# Patient Record
Sex: Male | Born: 1975 | Race: White | Hispanic: No | Marital: Single | Smoking: Current every day smoker
Health system: Southern US, Community
[De-identification: ages and names within clinical notes are randomized; demographics above are authoritative.]

---

## 2004-09-11 ENCOUNTER — Inpatient Hospital Stay: Payer: Self-pay | Admitting: Surgery

## 2005-08-16 ENCOUNTER — Ambulatory Visit: Payer: Self-pay | Admitting: Family Medicine

## 2006-10-18 ENCOUNTER — Emergency Department: Payer: Self-pay | Admitting: Emergency Medicine

## 2006-10-21 ENCOUNTER — Emergency Department: Payer: Self-pay | Admitting: Emergency Medicine

## 2008-02-25 ENCOUNTER — Emergency Department: Payer: Self-pay | Admitting: Emergency Medicine

## 2011-07-24 ENCOUNTER — Emergency Department: Payer: Self-pay | Admitting: Internal Medicine

## 2011-07-24 LAB — DRUG SCREEN, URINE
Amphetamines, Ur Screen: NEGATIVE (ref ?–1000)
Barbiturates, Ur Screen: NEGATIVE (ref ?–200)
Cannabinoid 50 Ng, Ur ~~LOC~~: POSITIVE (ref ?–50)
Cocaine Metabolite,Ur ~~LOC~~: NEGATIVE (ref ?–300)
Methadone, Ur Screen: NEGATIVE (ref ?–300)
Tricyclic, Ur Screen: NEGATIVE (ref ?–1000)

## 2011-07-24 LAB — COMPREHENSIVE METABOLIC PANEL
Alkaline Phosphatase: 92 U/L (ref 50–136)
Calcium, Total: 9 mg/dL (ref 8.5–10.1)
Chloride: 102 mmol/L (ref 98–107)
Co2: 26 mmol/L (ref 21–32)
EGFR (African American): >60
EGFR (Non-African Amer.): >60
Osmolality: 274 (ref 275–301)
SGPT (ALT): 48 U/L
Total Protein: 7.7 g/dL (ref 6.4–8.2)

## 2011-07-24 LAB — URINALYSIS, COMPLETE
Bacteria: NONE SEEN
Bilirubin,UR: NEGATIVE
Blood: NEGATIVE
Leukocyte Esterase: NEGATIVE
Nitrite: NEGATIVE
Ph: 6 (ref 4.5–8.0)
RBC,UR: NONE SEEN /HPF (ref 0–5)
Squamous Epithelial: NONE SEEN
WBC UR: NONE SEEN /HPF (ref 0–5)

## 2011-07-24 LAB — CBC
HCT: 43.1 % (ref 40.0–52.0)
HGB: 15.1 g/dL (ref 13.0–18.0)
MCH: 34.4 pg — ABNORMAL HIGH (ref 26.0–34.0)
MCHC: 35.1 g/dL (ref 32.0–36.0)
MCV: 98 fL (ref 80–100)
Platelet: 312 10*3/uL (ref 150–440)
RDW: 12.5 % (ref 11.5–14.5)

## 2011-07-24 LAB — ETHANOL
Ethanol %: 0.084 % — ABNORMAL HIGH (ref 0.000–0.080)
Ethanol: 84 mg/dL

## 2011-07-25 LAB — POTASSIUM: Potassium: 5 mmol/L (ref 3.5–5.1)

## 2011-10-30 ENCOUNTER — Emergency Department: Payer: Self-pay | Admitting: Emergency Medicine

## 2011-10-30 LAB — CBC
HCT: 44.4 % (ref 40.0–52.0)
HGB: 14.8 g/dL (ref 13.0–18.0)
MCH: 32.5 pg (ref 26.0–34.0)
MCHC: 33.4 g/dL (ref 32.0–36.0)
MCV: 97 fL (ref 80–100)
Platelet: 335 10*3/uL (ref 150–440)
RBC: 4.55 10*6/uL (ref 4.40–5.90)
RDW: 13.1 % (ref 11.5–14.5)
WBC: 15.3 10*3/uL — ABNORMAL HIGH (ref 3.8–10.6)

## 2011-10-30 LAB — URINALYSIS, COMPLETE
Bacteria: NONE SEEN
Bilirubin,UR: NEGATIVE
Blood: NEGATIVE
Glucose,UR: NEGATIVE mg/dL (ref 0–75)
Ketone: NEGATIVE
Leukocyte Esterase: NEGATIVE
Nitrite: NEGATIVE
Ph: 5 (ref 4.5–8.0)
Protein: 30
RBC,UR: NONE SEEN /HPF (ref 0–5)
Specific Gravity: 1.023 (ref 1.003–1.030)
Squamous Epithelial: NONE SEEN
WBC UR: <1 /HPF (ref 0–5)

## 2011-10-30 LAB — COMPREHENSIVE METABOLIC PANEL
Albumin: 3.8 g/dL (ref 3.4–5.0)
Alkaline Phosphatase: 141 U/L — ABNORMAL HIGH (ref 50–136)
Anion Gap: 7 (ref 7–16)
BUN: 9 mg/dL (ref 7–18)
Bilirubin,Total: 0.7 mg/dL (ref 0.2–1.0)
Calcium, Total: 9 mg/dL (ref 8.5–10.1)
Chloride: 105 mmol/L (ref 98–107)
Co2: 25 mmol/L (ref 21–32)
Creatinine: 0.84 mg/dL (ref 0.60–1.30)
EGFR (African American): >60
EGFR (Non-African Amer.): >60
Glucose: 100 mg/dL — ABNORMAL HIGH (ref 65–99)
Osmolality: 273 (ref 275–301)
Potassium: 4.1 mmol/L (ref 3.5–5.1)
SGOT(AST): 49 U/L — ABNORMAL HIGH (ref 15–37)
SGPT (ALT): 88 U/L — ABNORMAL HIGH
Sodium: 137 mmol/L (ref 136–145)
Total Protein: 7.9 g/dL (ref 6.4–8.2)

## 2011-11-06 ENCOUNTER — Emergency Department: Payer: Self-pay | Admitting: *Deleted

## 2011-11-07 LAB — DRUG SCREEN, URINE
Cocaine Metabolite,Ur ~~LOC~~: NEGATIVE (ref ?–300)
MDMA (Ecstasy)Ur Screen: NEGATIVE (ref ?–500)
Methadone, Ur Screen: NEGATIVE (ref ?–300)
Opiate, Ur Screen: NEGATIVE (ref ?–300)
Phencyclidine (PCP) Ur S: NEGATIVE (ref ?–25)

## 2011-11-07 LAB — COMPREHENSIVE METABOLIC PANEL
Albumin: 4.4 g/dL (ref 3.4–5.0)
Anion Gap: 11 (ref 7–16)
BUN: 8 mg/dL (ref 7–18)
Bilirubin,Total: 0.5 mg/dL (ref 0.2–1.0)
Calcium, Total: 9.7 mg/dL (ref 8.5–10.1)
Creatinine: 0.76 mg/dL (ref 0.60–1.30)
EGFR (African American): >60
EGFR (Non-African Amer.): >60
Glucose: 79 mg/dL (ref 65–99)
Osmolality: 279 (ref 275–301)
Potassium: 4 mmol/L (ref 3.5–5.1)
SGOT(AST): 55 U/L — ABNORMAL HIGH (ref 15–37)
SGPT (ALT): 88 U/L — ABNORMAL HIGH
Sodium: 141 mmol/L (ref 136–145)

## 2011-11-07 LAB — ACETAMINOPHEN LEVEL: Acetaminophen: <2 ug/mL

## 2011-11-07 LAB — ETHANOL
Ethanol %: 0.185 % — ABNORMAL HIGH (ref 0.000–0.080)
Ethanol: 185 mg/dL

## 2011-11-07 LAB — CBC
HGB: 16.8 g/dL (ref 13.0–18.0)
MCHC: 33.3 g/dL (ref 32.0–36.0)
MCV: 97 fL (ref 80–100)
Platelet: 422 10*3/uL (ref 150–440)
RBC: 5.22 10*6/uL (ref 4.40–5.90)
WBC: 13.3 10*3/uL — ABNORMAL HIGH (ref 3.8–10.6)

## 2012-05-24 LAB — DRUG SCREEN, URINE
Barbiturates, Ur Screen: NEGATIVE (ref ?–200)
Benzodiazepine, Ur Scrn: POSITIVE (ref ?–200)
Cannabinoid 50 Ng, Ur ~~LOC~~: POSITIVE (ref ?–50)
Cocaine Metabolite,Ur ~~LOC~~: NEGATIVE (ref ?–300)
MDMA (Ecstasy)Ur Screen: NEGATIVE (ref ?–500)
Phencyclidine (PCP) Ur S: NEGATIVE (ref ?–25)
Tricyclic, Ur Screen: NEGATIVE (ref ?–1000)

## 2012-05-24 LAB — COMPREHENSIVE METABOLIC PANEL
Albumin: 4.2 g/dL (ref 3.4–5.0)
Alkaline Phosphatase: 126 U/L (ref 50–136)
BUN: 9 mg/dL (ref 7–18)
Calcium, Total: 8.5 mg/dL (ref 8.5–10.1)
Chloride: 117 mmol/L — ABNORMAL HIGH (ref 98–107)
Creatinine: 0.84 mg/dL (ref 0.60–1.30)
EGFR (African American): >60
Osmolality: 293 (ref 275–301)
Potassium: 4.2 mmol/L (ref 3.5–5.1)
Sodium: 148 mmol/L — ABNORMAL HIGH (ref 136–145)

## 2012-05-24 LAB — TSH: Thyroid Stimulating Horm: 1.43 u[IU]/mL

## 2012-05-24 LAB — CBC
MCV: 99 fL (ref 80–100)
Platelet: 370 10*3/uL (ref 150–440)
RBC: 4.9 10*6/uL (ref 4.40–5.90)
WBC: 8.3 10*3/uL (ref 3.8–10.6)

## 2012-05-25 ENCOUNTER — Inpatient Hospital Stay: Payer: Self-pay | Admitting: Psychiatry

## 2012-05-25 LAB — URINALYSIS, COMPLETE
Blood: NEGATIVE
Glucose,UR: NEGATIVE mg/dL (ref 0–75)
Leukocyte Esterase: NEGATIVE
Nitrite: NEGATIVE
Protein: NEGATIVE
Specific Gravity: 1.013 (ref 1.003–1.030)
Squamous Epithelial: <1
WBC UR: <1 /HPF (ref 0–5)

## 2012-08-06 IMAGING — US US PELVIS LIMITED
1 series · 13 of 25 positions shown · non-contrast
Comparison: none

REASON FOR EXAM: abscess, extreme swelling
COMMENTS:   May transport without cardiac monitor

[Series 1: us pelvis limited · 0.08mm/px · 92 acquisitions, 13 frames shown]
[im 1/92]
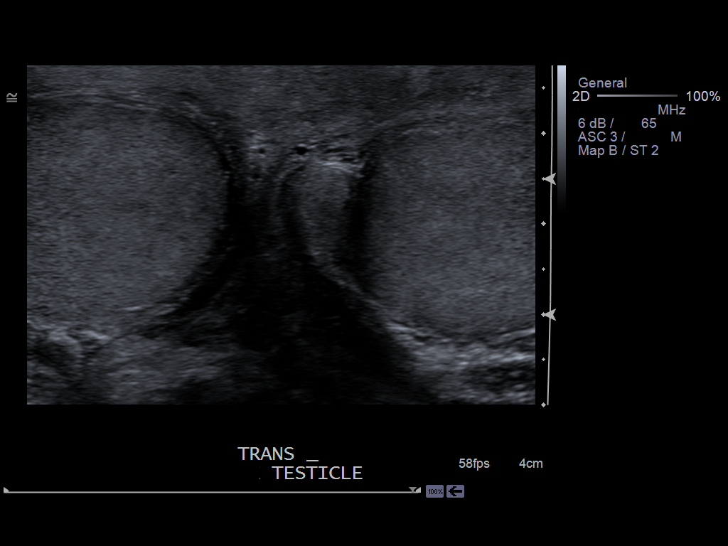
[im 8/92]
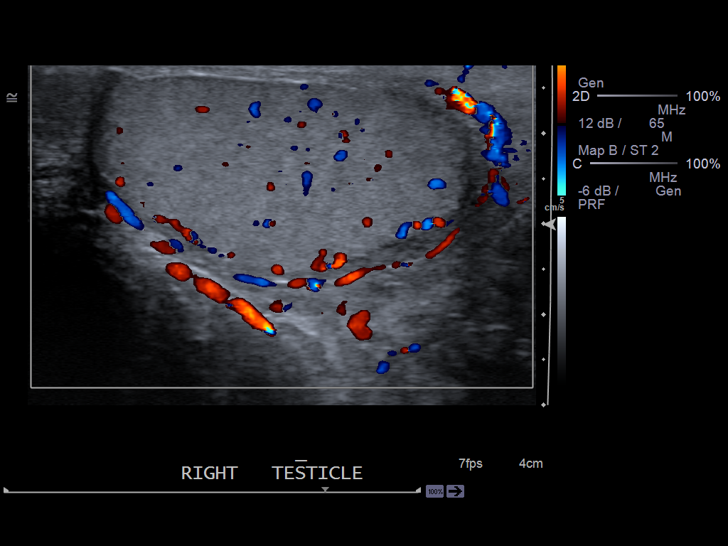
[im 16/92]
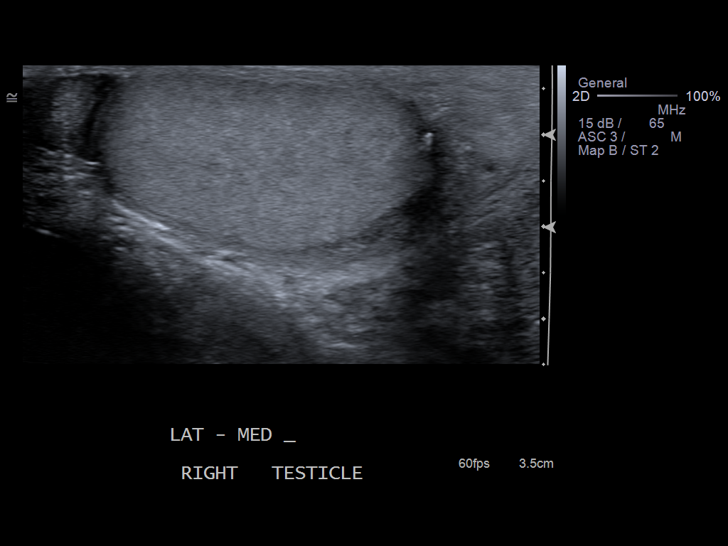
[im 23/92]
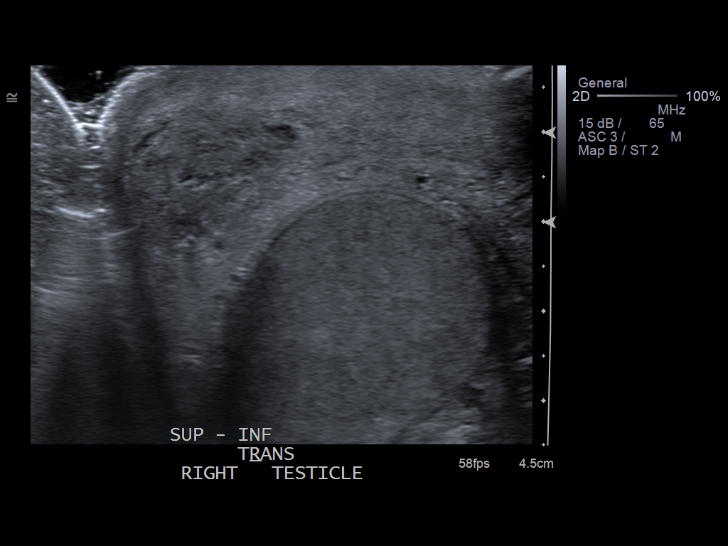
[im 31/92]
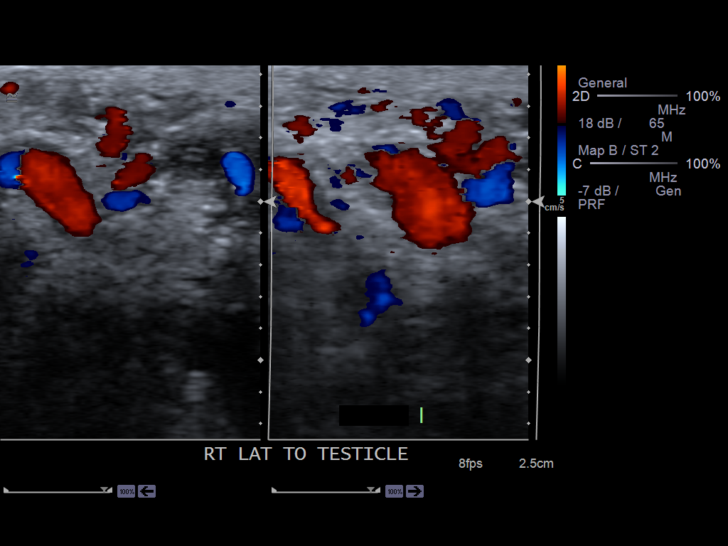
[im 38/92]
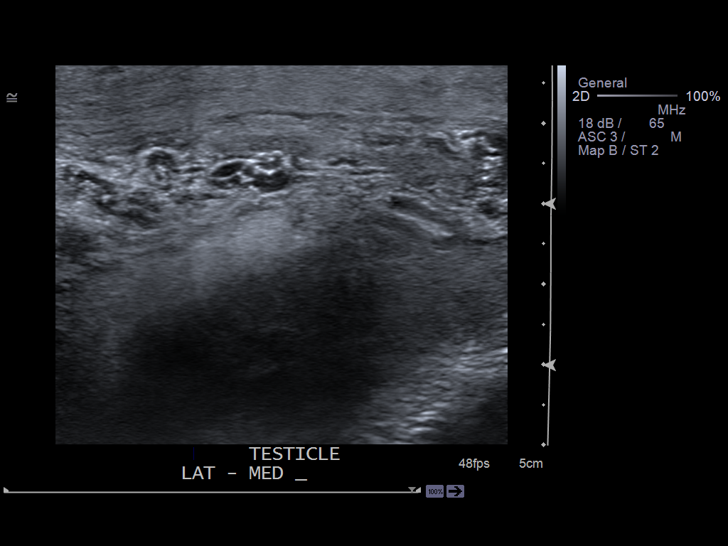
[im 46/92]
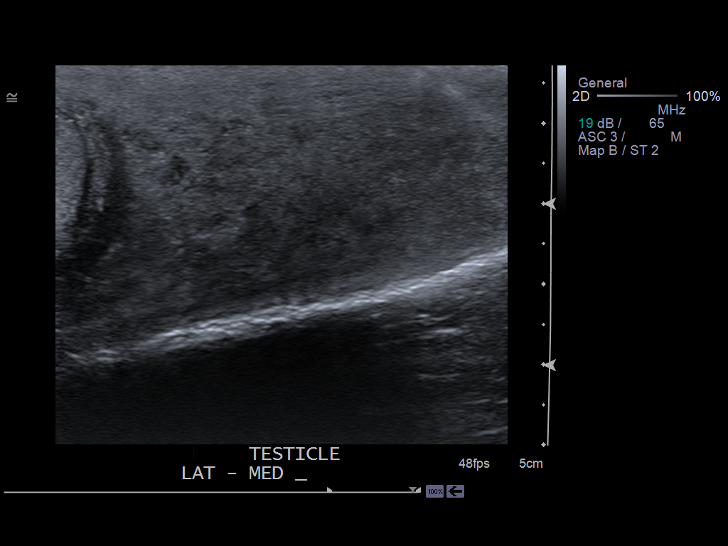
[im 54/92]
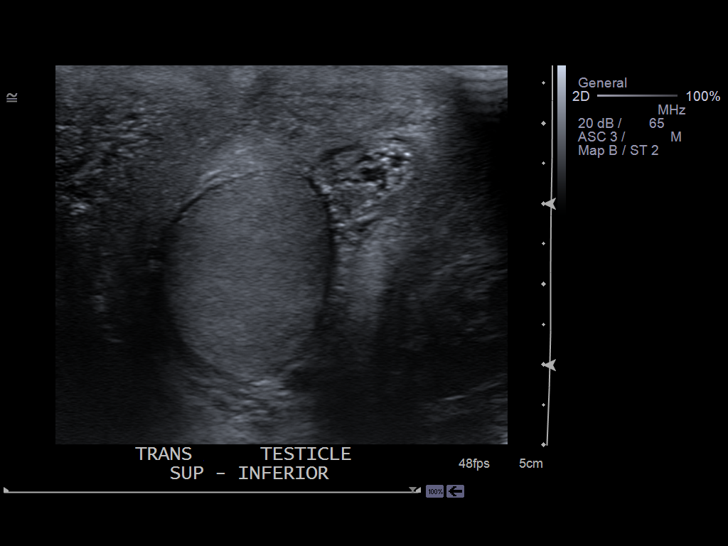
[im 61/92]
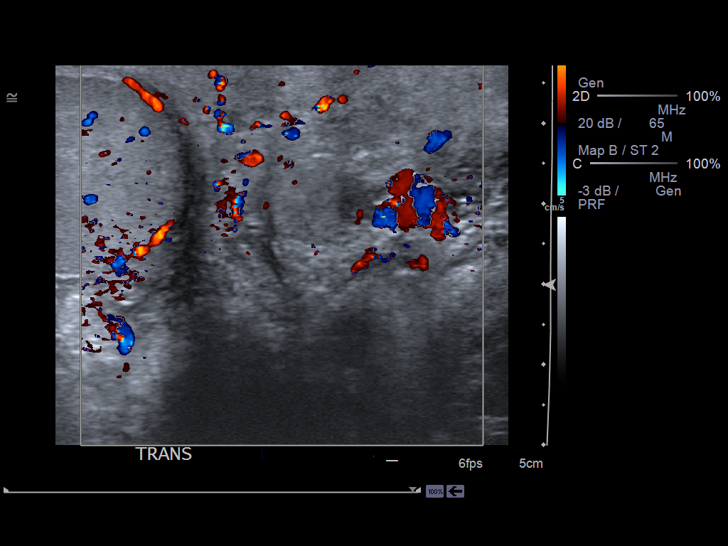
[im 69/92]
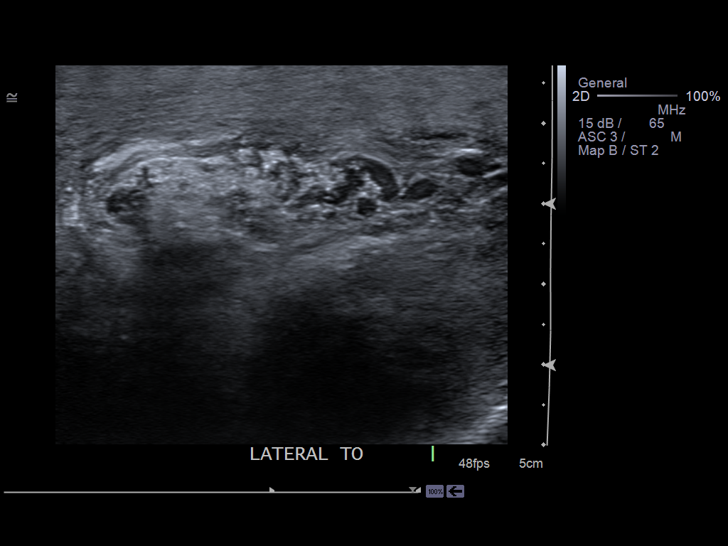
[im 76/92]
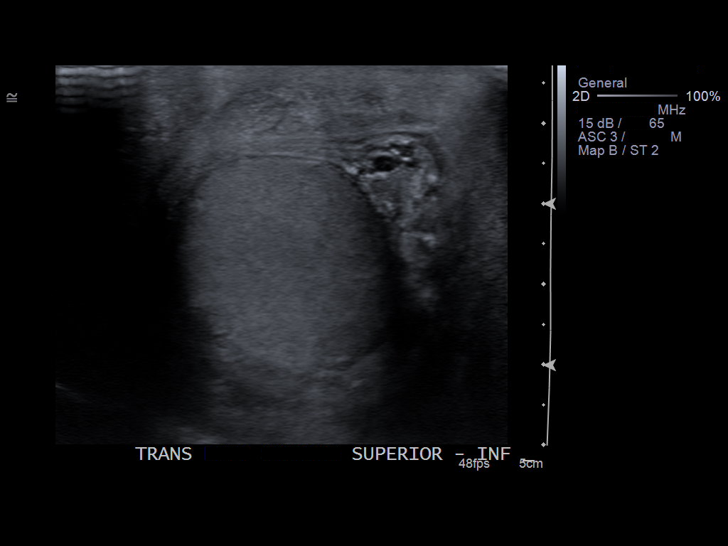
[im 84/92]
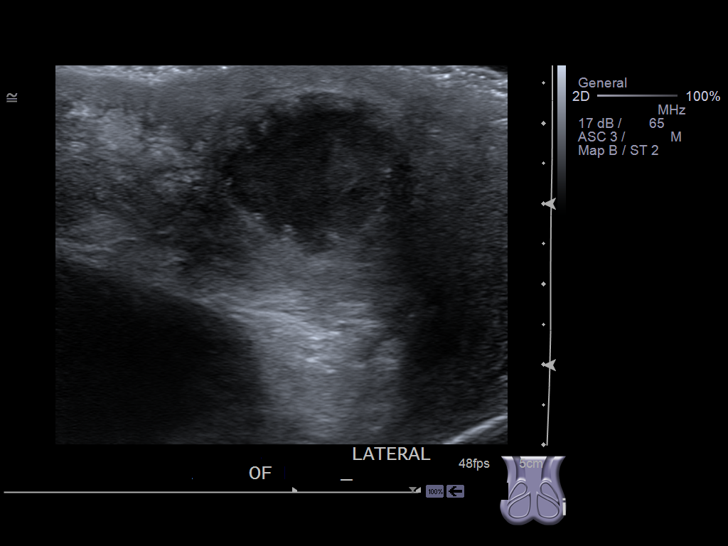
[im 92/92]
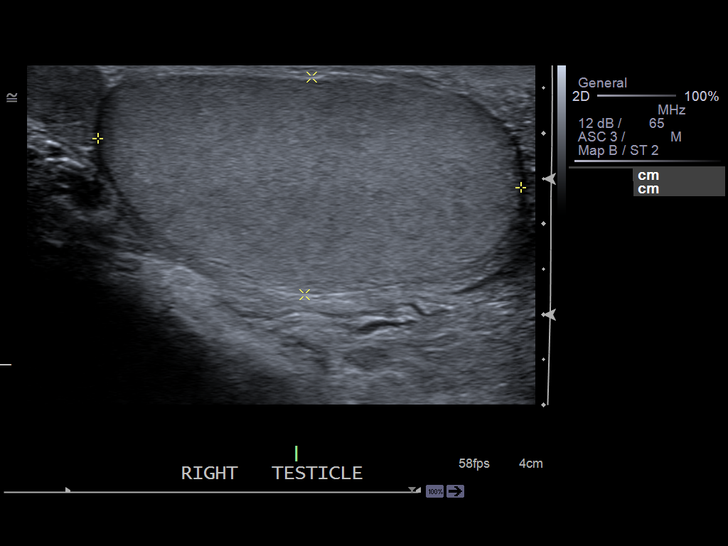

[13 of 25 positions shown; findings below may reference images not displayed]

PROCEDURE:     US  - US TESTICULAR  - October 30, 2011 [DATE]

RESULT:     The right testicle measures 4.71 cm x 3.08 cm x 2.4 cm and the
left testicle measures 4.58 cm x 2.9 cm x 2.58 cm. No intratesticular mass
is identified. There is observed symmetrical arterial and venous vascular
flow in the testicles. No torsion is seen. The epididymis is normal in
appearance bilaterally. Incidental note is made of a 3.4 mm left epididymal
cyst.

The left scrotal wall is thickened. Within the left scrotal wall there is
observed a 1.99 cm complex hypoechoic area. The differential includes
residual change from prior abscess, residual area of phlegmon in the scrotal
wall, hematoma or complex seroma. The region on Doppler examination does not
appear particularly hypervascular. The finding, however, could still
represent residual change from an abscess that is responding to treatment.
IMPRESSION: 1. There is thickening of the left scrotal wall with a 1.99 cm complex
hypoechoic area identified within the scrotal wall. This area does not show
hypervascularity as would be expected with an acute inflammatory process,
but still could represent an inflammatory process that has responded to
treatment but not yet totally resolved.
2. No intratesticular mass is seen.
3. No torsion is identified.

[REDACTED]

## 2014-04-02 ENCOUNTER — Emergency Department: Payer: Self-pay | Admitting: Emergency Medicine

## 2014-04-03 LAB — COMPREHENSIVE METABOLIC PANEL
ALK PHOS: 116 U/L
ALT: 92 U/L — AB
AST: 53 U/L — AB (ref 15–37)
Albumin: 3.8 g/dL (ref 3.4–5.0)
Anion Gap: 10 (ref 7–16)
BUN: 11 mg/dL (ref 7–18)
Bilirubin,Total: 0.9 mg/dL (ref 0.2–1.0)
CHLORIDE: 106 mmol/L (ref 98–107)
Calcium, Total: 8.1 mg/dL — ABNORMAL LOW (ref 8.5–10.1)
Co2: 24 mmol/L (ref 21–32)
Creatinine: 0.73 mg/dL (ref 0.60–1.30)
EGFR (African American): >60
Glucose: 91 mg/dL (ref 65–99)
Osmolality: 278 (ref 275–301)
Potassium: 3.5 mmol/L (ref 3.5–5.1)
SODIUM: 140 mmol/L (ref 136–145)
TOTAL PROTEIN: 7.7 g/dL (ref 6.4–8.2)

## 2014-04-03 LAB — CBC
HCT: 48.8 % (ref 40.0–52.0)
HGB: 16.2 g/dL (ref 13.0–18.0)
MCH: 34.4 pg — AB (ref 26.0–34.0)
MCHC: 33.2 g/dL (ref 32.0–36.0)
MCV: 104 fL — ABNORMAL HIGH (ref 80–100)
Platelet: 327 10*3/uL (ref 150–440)
RBC: 4.7 10*6/uL (ref 4.40–5.90)
RDW: 12.5 % (ref 11.5–14.5)
WBC: 13.4 10*3/uL — ABNORMAL HIGH (ref 3.8–10.6)

## 2014-04-03 LAB — ETHANOL: Ethanol: 361 mg/dL

## 2014-04-03 LAB — PROTIME-INR
INR: 0.9
Prothrombin Time: 12.4 secs (ref 11.5–14.7)

## 2014-09-03 NOTE — H&P (Signed)
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 The patient has been married for five years and lives with his wife, who is 65 years old.  His stepson lives with them, who is 14 years old and a daughter who is 25 years old.  The patient comes for inpatient psychiatry at [facility] with a chief complaint of "my sister thought I was going to hurt myself.  I took my 0.22 rifle from my father, without his permission, because I wanted to scare my neighbor's cat, because my neighbor's cats were messing with trash can."  "I love pets, why would I kill them."   HISTORY OF PRESENT ILLNESS: According to information obtained from the chart, the patient was committed a few months ago for taking a gun to his head.  He had guns removed from the home thereafter.  He was prescribed antidepressants, which he does not take as prescribed. For the past week he has been talking about killing himself.  He was intoxicated to the extent that he was unresponsive for 30 minutes and EMT and LEO were dispatched, but left at the scene.  He was at his sister's house to retrieve a 0.22 rifle.  Based on responding to suicidal statements, recent suicide attempt and intoxication, he appeared to [employer].  The patient absolutely denies any of these statements and he reports that he is not depressed and he is eager to go back to work and if he does not report to work on 05/26/2012, he may lose his job and smiled while talking about the same.  He reports that he took his father's rifle without his father's permission in order to "scare his neighbor's cats, who were messing with the neighbor's trash cans."  PAST PSYCHIATRIC HISTORY:  The patient reports that he has not been an inpatient for one day at [facility] since March  2013, when he was discharged to ____________ for alcohol drinking problems.  No history of suicide attempts.  Was being followed by Joycelyn Man and he could not relate with him.  He has an appointment coming up at [facility].    MEDICATIONS:  The patient reports he is not on any medications at this time.  FAMILY HISTORY:  Raised by parents. Father is an Personnel officer.  Father is living and is 57 years old.  Mother died of brain cancer at age 8 years.  He has one sister who is 68 years old.  PERSONAL HISTORY:  Born in [place].  Graduated from high school.  Took courses at [employer] for Emergency planning/management officer.    WORK HISTORY:  Longest job he has held was being an Personnel officer for 15 years.  SOCIAL HISTORY:  He was married once, married for five years.  He has a daughter from a previous relationship, who is 11 years old and pays child support of $100 per week.  He has a stepson, who is 63 years old that lives with the patient and his wife.    ALCOHOL AND DRUGS:   First drink was at 16 years, became a problem by the age of 21 years. Had been drinking at the rate of 46 cans of beer per week and recently he cut down and he was drinking at the rate of 6 to 8 cans of beer  per day and then last week he cut it down to 6 beers over a week.  However, last night he was with two friends and was drinking six drinks of alcohol and he was intoxicated.  He has had three DWIs and lost his drivers license.  He gets around by getting rides from his wife and his father and friends. He was never arrested for public drunkenness.  No history of DTs, tremors, seizures or blackouts.  He denies street or prescription drug abuse.  Denies using IV drugs.  Does admit to smoking nicotine cigs for many years.  PAST MEDICAL HISTORY: No high blood pressure. No diabetes. No major surgery.  or injuries. Never been unconscious.  No known drug allergies.  He is not being followed by any physicians at this time and goes  to the emergency room as needed.  PHYSICAL EXAMINATION:   VITAL SIGNS:  Temperature 97.4, pulse 70 per minute, respirations 20 per minute, blood pressure 120/70 mmHg. HEENT:  Normocephalic, atraumatic.  PERRLA, EOMI.  Tympanic membranes visualised NECK:  Supple.   LUNGS:  Chest normal expansion. CARDIAC:  Hear heart _sounds and no  murmurs or rubs. ABDOMEN:  Soft. and no organomegaly. GU:  Rectal deferred. NEUROLOGIC:  Gait is normal.  Romberg negative.  Cranial nerves 2 through 12 intact. DTRs 2+ and plantars normal.   MENTAL STATUS EXAMINATION:  The patient is dressed in street clothes. Alert and oriented to place, person and time.  and kiew the reason thatbrought him [facility].  Absolutely denies any of the statements that were made on the IVC papers.  No psychosis, denies hearing voices or seeing things or  delusional or paranoid thinking.  Denies any suicidal or homicidal ideation or plan.  He reports that his family has the wrong impression about the same.  He could spell the word world forward and backward without any problems.  Cognition intact.   Recall and memory are good.  He could count the money.  Denies any appetite or sleep disturbance.  Insight and judgment guarded.  IMPRESSION:   AXIS I:   Alcohol dependence, chronic, continuous, with intoxication at time of admission. Nicotine dependence; rule out major depressive disorder, with psychosis.  This needs further evaluation.  AXIS II:  Deferred. AXIS III:  None major. AXIS IV:  Moderate, this is his second admission to [facility] for a similar problem of taking a gun to shoot his neighbors cats, who are giving him trouble. AXIS V:  GAF 25.  The patient was [facility] to the [facility]   He will be started on antipsychotic medications, and  lorazepam to calm him down  He will be started on CIWA protocol to help him detox from his alcohol.  During his stay in the hospital he will be given milieu and  supportive counseling.  His substance abuse issues will be addressed.  At the time of discharge, social services will contact the family and find out more details and will contact [facility] and [facility] to find out more details about the patient.  The patient will be stabilized and appropriate followup appointments will be made.    ____________________________ Jannet MantisSurya K. Guss Bundehalla, MD skc:eg D: 05/25/2012 18:52:55 ET T: 05/25/2012 22:00:10 ET JOB#: [id number]  cc: Monika SalkSurya K. Guss Bundehalla, MD, <Dictator> Beau FannySURYA K Maksim Peregoy MD ELECTRONICALLY SIGNED 05/31/2012 18:03

## 2014-09-05 NOTE — Consult Note (Signed)
PATIENT NAME:  [name] [name], [name] [name] MR#:  [id number] DATE OF BIRTH:  1975-09-20  DATE OF CONSULTATION:  11/06/2011  REFERRING PHYSICIAN:  Dr. Jens Som  CONSULTING PHYSICIAN:  Adelene Amas. Ezequiel Macauley, MD  REASON FOR CONSULTATION: Alcohol intoxication and destructive thinking with speaking thoughts about harming himself and others.   HISTORY OF PRESENT ILLNESS: [name] [name] had achieved sobriety with alcohol for several weeks after a rehabilitation program. [name] [name] he relapsed drinking at least six beers and at least four shots of vodka, probably more. During the episode, he experienced severe shame and irritability. He had arguments. He stated that he was going to kill himself. There was also report that he made a threat to kill his wife. His wife took out involuntary commitment papers on him and he was brought to the Emergency Department. When the patient arrived he stated clearly and admitted that he was having thoughts of harming himself but that he would never hurt anyone else. He has been highly stressed about not being able to obtain a job.   Since the intoxication from alcohol has worn off, he has appropriate regret about his alcohol abuse as well as his behavior during the episode.   There was an additional report from the paperwork that the patient had been sitting in a closet with a gun threatening to kill himself. All of the weapons were removed from the house by the police when emergency services were called. Again, as noted above the patient regrets his actions and has recovered his judgment. He and his wife have been discussing his situation. She is supportive of his rehabilitation and has set up an outpatient substance abuse counseling program for him when he leaves the Emergency Room.    He has solid hope and constructive future goals. He wants to work on his 12-step method. He states that he never did get a sponsor and plans on getting one. Regarding the possibility of anxiety or depressive  disorder, this is not clear. He clearly does not have a severe depression, however, the undersigned did point out that it would be the best idea for him to go to [facility] upon leaving the Emergency Room to have his state re-examined for any residual anxiety symptoms or depressive symptoms that could be treated. He is not having any thoughts of harming himself or others. He has solid interest. He has no hallucinations or delusions. His orientation and memory function are intact.   PAST PSYCHIATRIC HISTORY: No history of suicide attempts. He has been having much trouble with alcohol dependence over several years. He has five DWI's and a revoked driver's license. He recently attended a residential rehabilitation program within this year and was encouraged by [facility].   FAMILY PSYCHIATRIC HISTORY: None known.   SOCIAL HISTORY: Please see above. He lives at home with his wife.   PAST MEDICAL HISTORY: None.   ALLERGIES: No known drug allergies.   MEDICATIONS: None.   LABORATORY DATA: SGPT 98, SGOT 55, WBC 13.3. Alcohol was 185 even by the time he had already arrived to the Emergency Department and have his labs drawn.   Urine drug screen, TSH, aspirin, and Tylenol all unremarkable.   REVIEW OF SYSTEMS: Constitutional, HEENT, mouth, neurologic, psychiatric, cardiovascular, respiratory, gastrointestinal, genitourinary, skin, musculoskeletal, hematologic, lymphatic, endocrine, metabolic all unremarkable.   PHYSICAL EXAMINATION:   VITAL SIGNS: Temperature 96.8, pulse 89, respiratory rate 16, blood pressure 121/71.   GENERAL APPEARANCE: [name] [name] is a well developed, well nourished male appearing his chronologic age sitting up  on his hospital gurney with no abnormal involuntary movement. He has no cachexia. His muscle tone is normal. His grooming and hygiene are normal.   MENTAL STATUS EXAMINATION: [name] [name] is alert. His eye contact is good. His concentration is normal. He is oriented to all  spheres. His memory is intact to immediate, recent, and remote. Abstraction is intact. Fund of knowledge, intelligence, and use of language normal. Speech involves normal rate and prosody without dysarthria. Thought process logical, coherent, and goal directed. No looseness of associations or tangents. Thought content he does have solid hope and constructive future goals. He has no thoughts of harming himself or others. He has no hallucinations or delusions. Insight is intact. Judgment is intact. Affect is mildly flat at baseline but with broad and appropriate range as the interview progresses. He appreciates the supportive psychotherapy and reinforcement of basic 12-step principles. His mood is normal. Judgment and insight are intact.   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  2. Alcohol dependence.   AXIS II: Deferred.   AXIS III: Mildly elevated transaminases due to alcohol intoxication.   AXIS IV: Primary support group, history of DWI's, and revoked license.   AXIS V: Occupational, economic.   [name] [name] is no longer at risk to harm himself or others now that he has recovered from his intoxicated state. The undersigned did recommend that he be entered into a residential chemical dependence rehabilitation program, however, the patient declined and he is not committable after recovering from his intoxicated state.   He chooses to attend the outpatient substance abuse program that his wife has set up for him. This [facility] to [facility] at [facility] on July [facility]. Also, the patient will walk in to [facility] tomorrow for discussion of possible medication for mild anxiety and any residual depressive symptoms that are mild but persistent. At this point it appears that he has recovered well but there is still the confounding variable of recent alcohol intoxication and he still has not achieved full confidence and complete elimination of guilt. He does not have full  energy. He does have excessive worry.   ____________________________ Adelene AmasJames S. Kayelee Herbig, MD jsw:drc D: 11/08/2011 09:05:56 ET T: 11/08/2011 09:32:37 ET JOB#: [id number]  cc: Adelene AmasJames S. Gibbs Naugle, MD, <Dictator> Lester CarolinaJAMES S Anisia Leija MD ELECTRONICALLY SIGNED 11/08/2011 9:53

## 2016-02-13 IMAGING — CR NASAL BONES - 3+ VIEW
1 series · 3 of 3 positions shown · non-contrast
Comparison: None.

CLINICAL DATA: Laceration to bridge of nose from fall.

EXAM:
NASAL BONES - 3+ VIEW

[Series 2: w waters pa · 0.14mm/px · 3 of 3 slices shown]
[im 1/3]
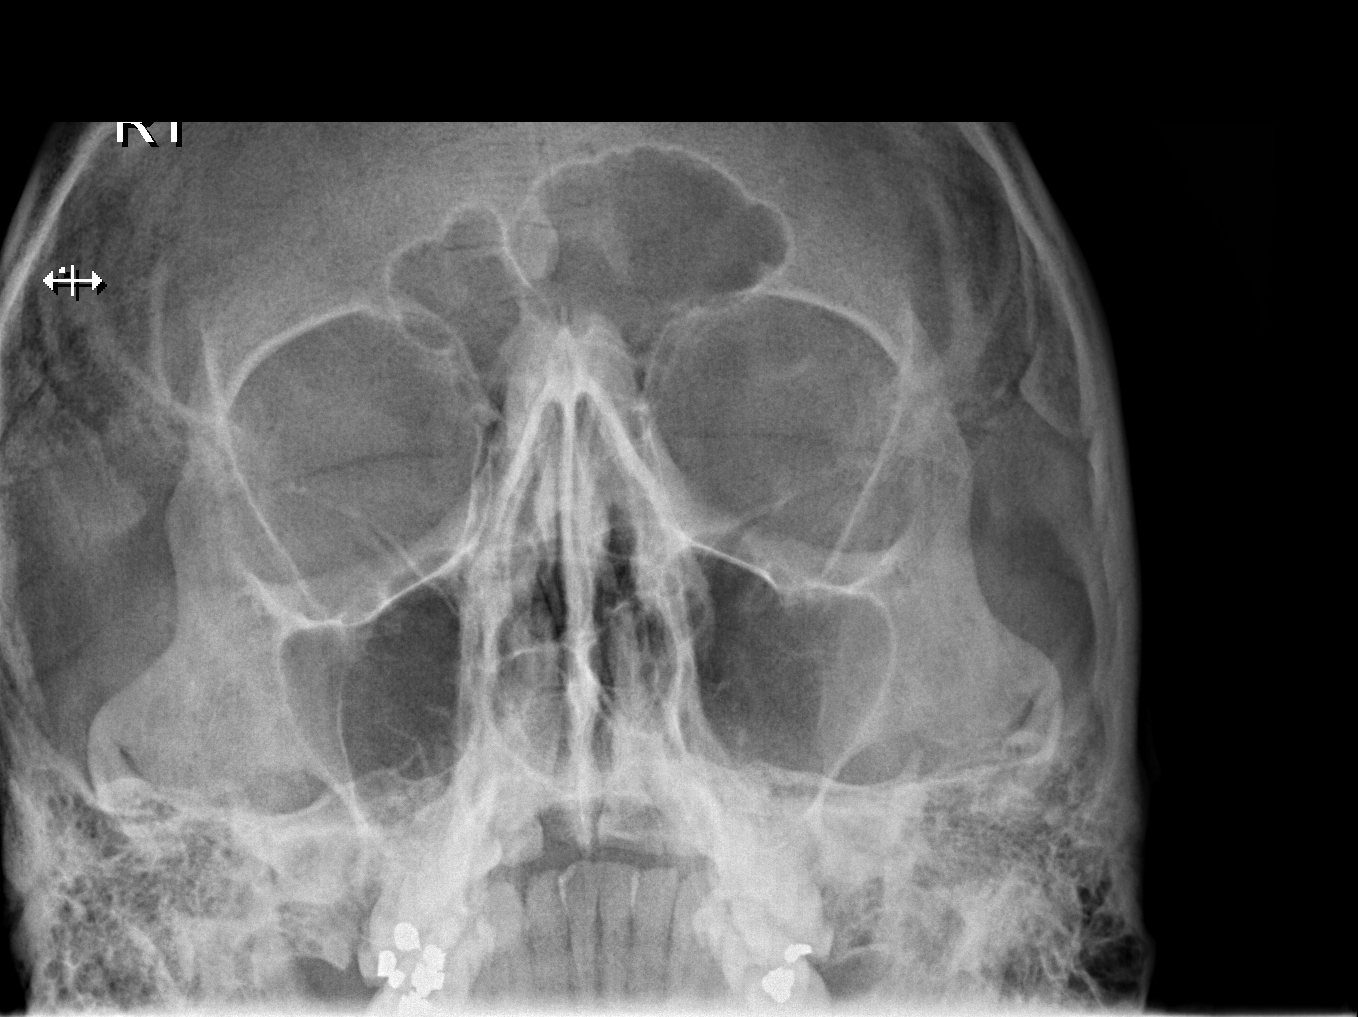
[im 2/3]
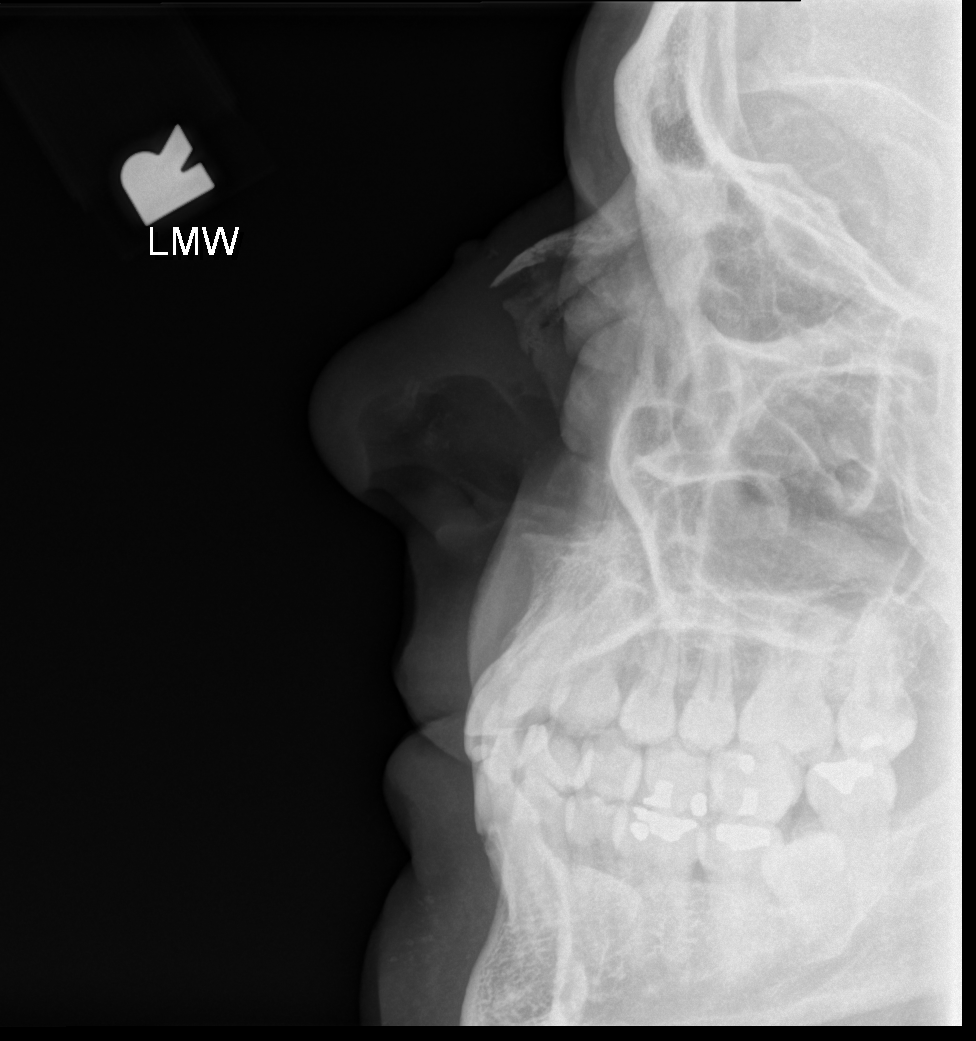
[im 3/3]
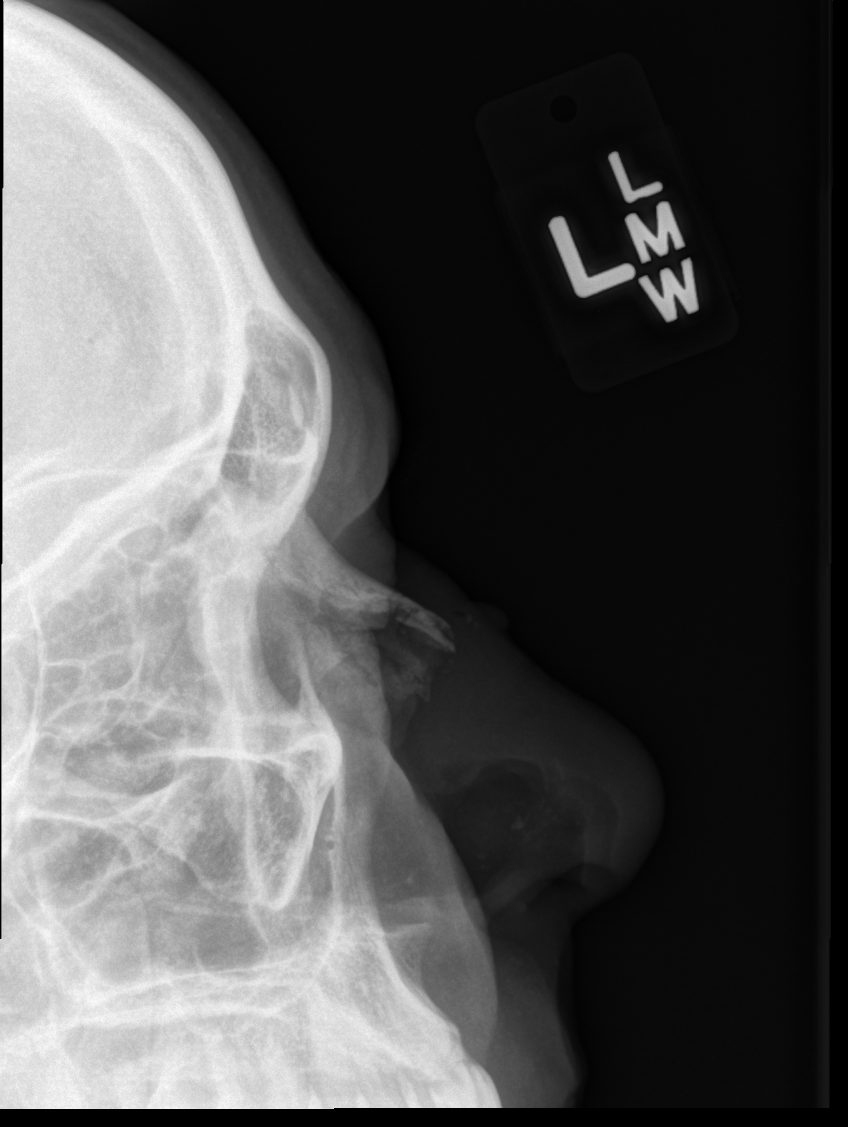

[3 of 3 positions shown; findings below may reference images not displayed]

FINDINGS: Minimally depressed fracture of the anterior nasal bone.
Calcifications in the soft tissues over the nasal bone may
represents small displaced fracture fragments. Nasal septum is
midline. Visualized paranasal sinuses are clear.
IMPRESSION: Minimally depressed fracture of the anterior nasal bone.

## 2016-02-13 IMAGING — CT CT HEAD WITHOUT CONTRAST
4 series · 18 of 30 positions shown, 19 images · non-contrast
Comparison: None.

CLINICAL DATA: Intoxicated fall with head and nasal bone trauma.
Lacerations and abrasions to the nose.

EXAM:
CT HEAD WITHOUT CONTRAST
TECHNIQUE: Contiguous axial images were obtained from the base of the skull
through the vertex without intravenous contrast.

[Series 2: head bone · axial · 0.43mm/px · z∈[+626,+770]mm · 8 of 88 slices shown]
[im 8/88  bone]
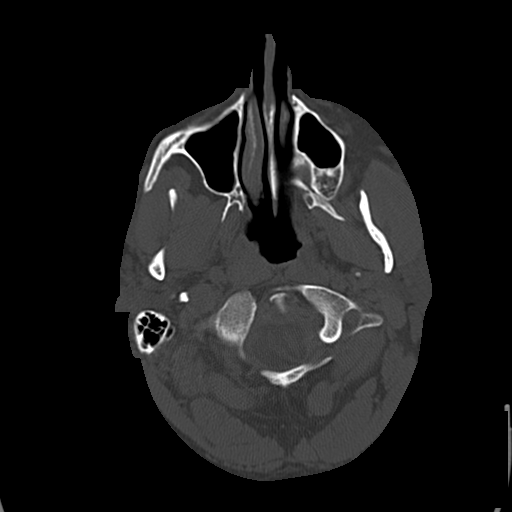
[im 16/88  bone]
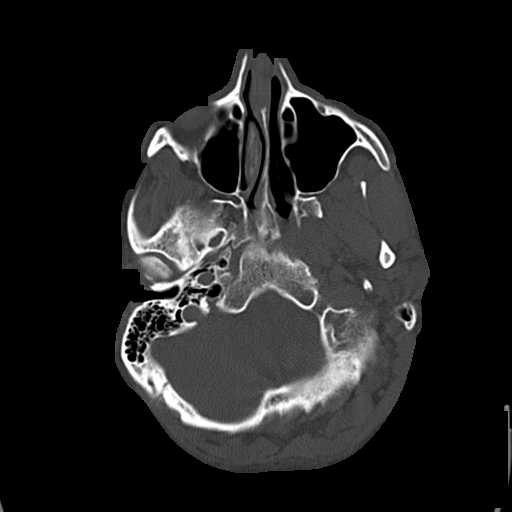
[im 32/88  bone]
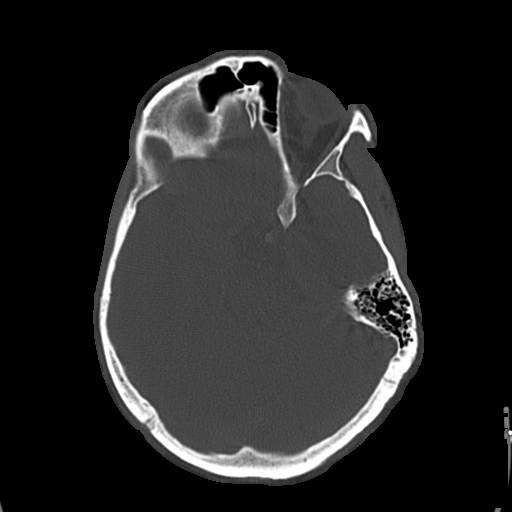
[im 40/88  bone]
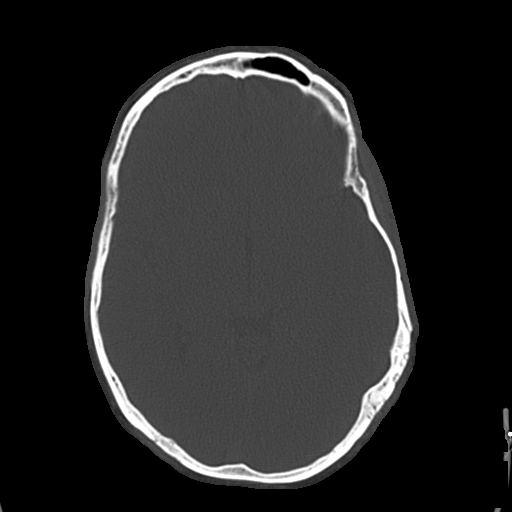
[im 48/88  bone]
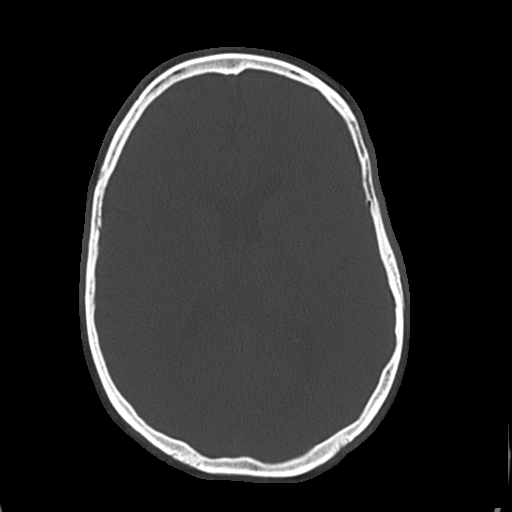
[im 56/88  bone]
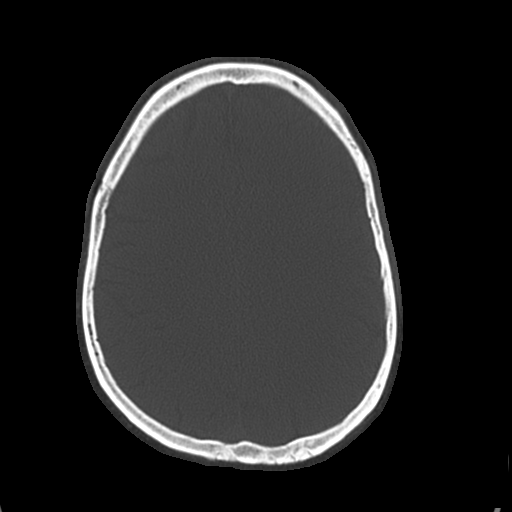
[im 72/88  bone]
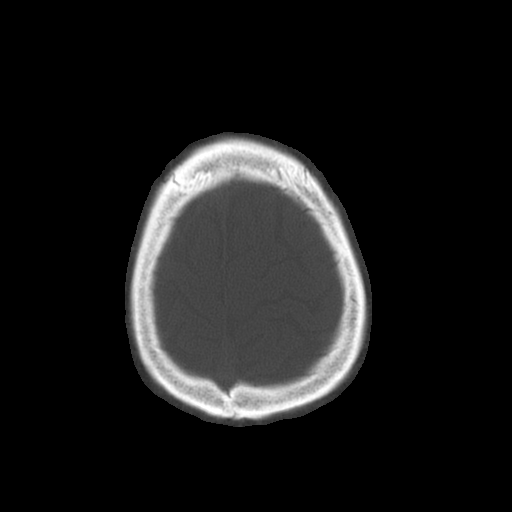
[im 80/88  bone]
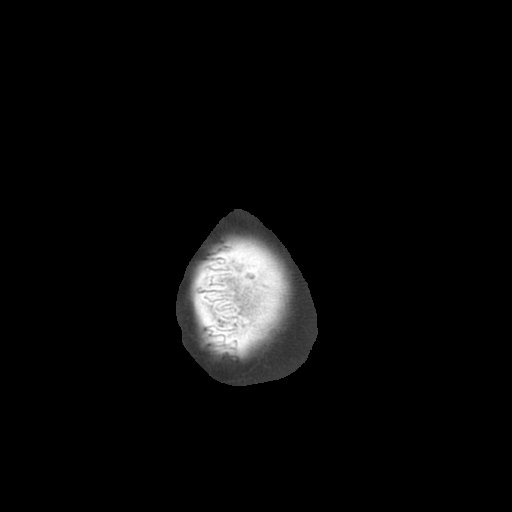

[Series 3: head wo · axial · 0.43mm/px · z∈[+671,+721]mm · 2 of 32 slices shown, 3 images]
[im 11/32  brain]
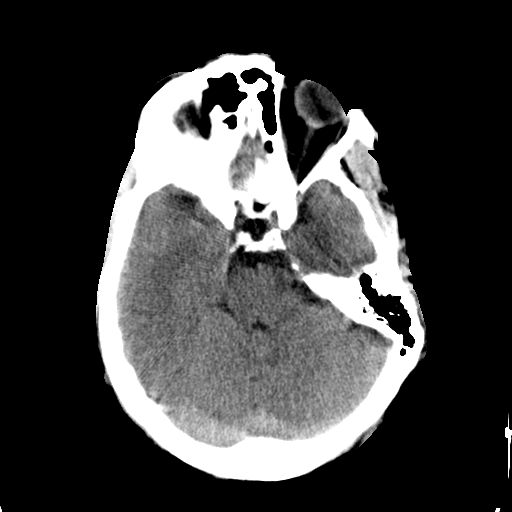
[im 11/32  bone]
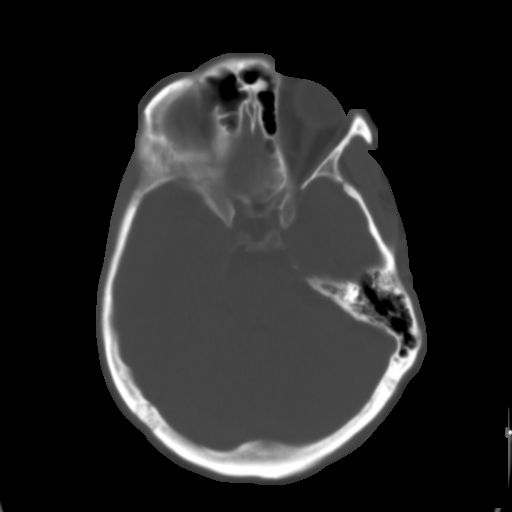
[im 21/32  brain]
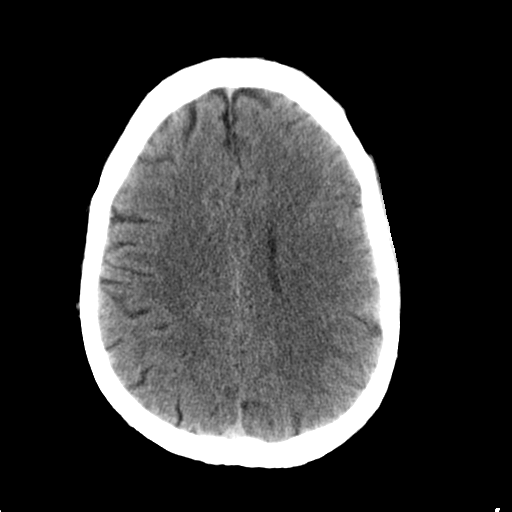

[Series 4: head wo recons · axial · 0.43mm/px · z∈[+715,+755]mm · 2 of 29 slices shown]
[im 10/29  brain]
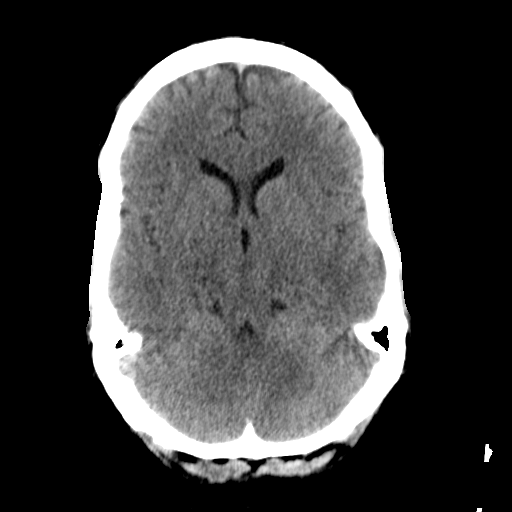
[im 19/29  brain]
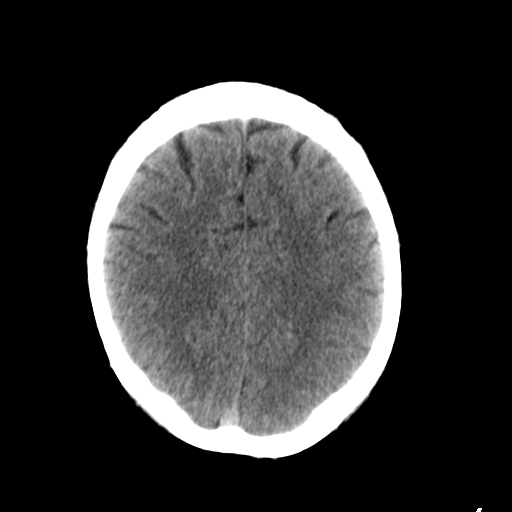

[Series 5: head bone recons · axial · 0.43mm/px · z∈[+678,+755]mm · 6 of 76 slices shown]
[im 9/76  bone]
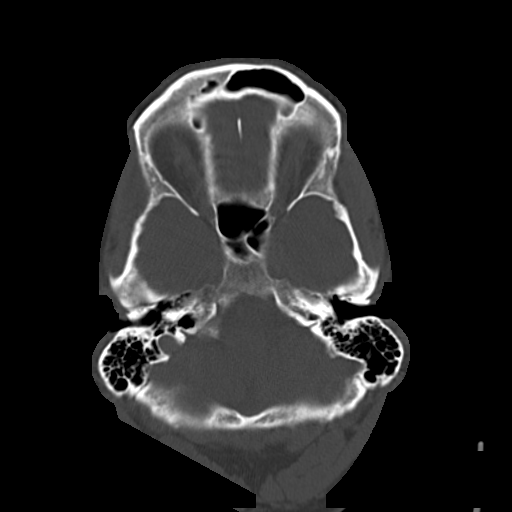
[im 17/76  bone]
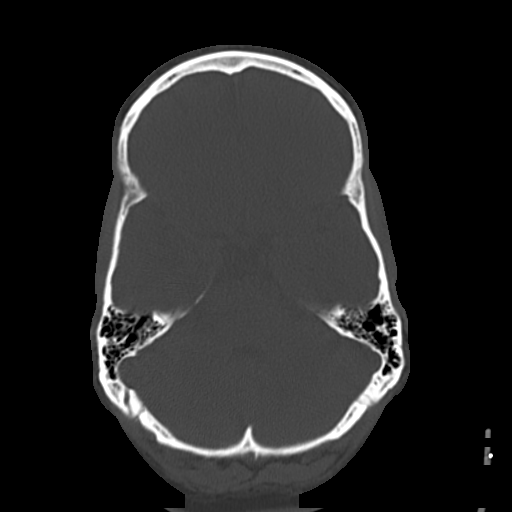
[im 26/76  bone]
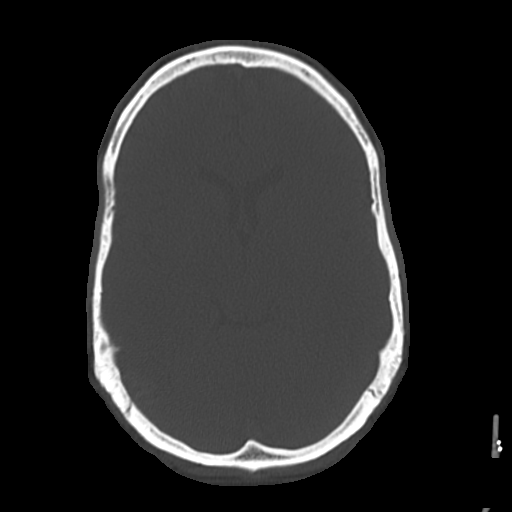
[im 34/76  bone]
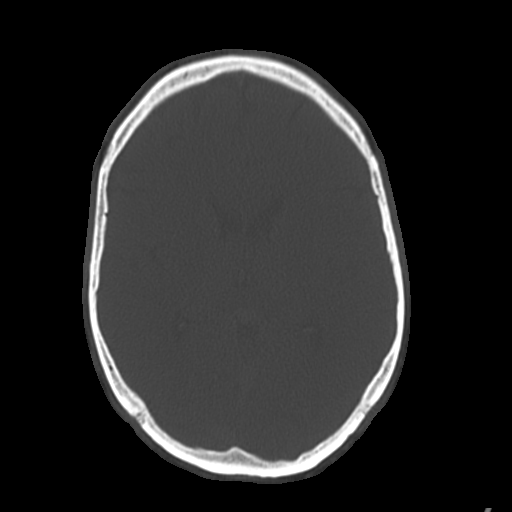
[im 42/76  bone]
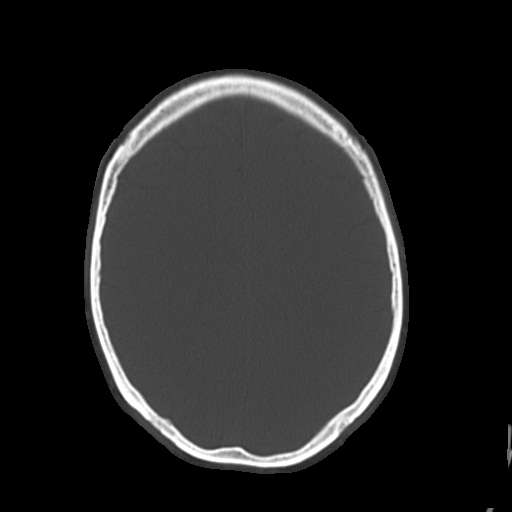
[im 51/76  bone]
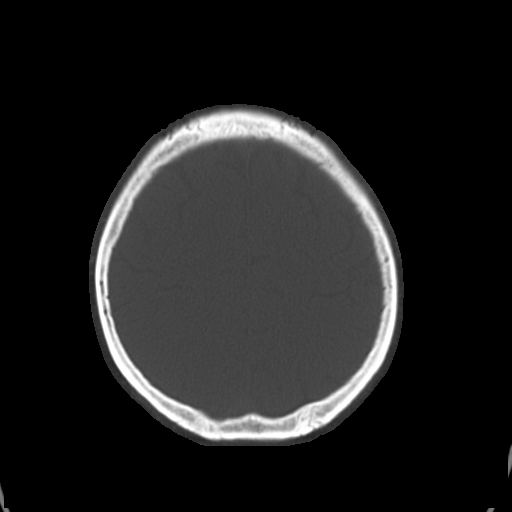

[18 of 30 positions shown; findings below may reference images not displayed]

FINDINGS: Ventricles and sulci appear symmetrical. Suggestion of minimally
depressed nasal bone fractures anteriorly with small subcutaneous
gas in the nasal soft tissues. No mass effect or midline shift. No
abnormal extra-axial fluid collections. Gray-white matter junctions
are distinct. Basal cisterns are not effaced. No evidence of acute
intracranial hemorrhage. No depressed skull fractures. Visualized
paranasal sinuses and mastoid air cells are not opacified.
IMPRESSION: No acute intracranial abnormalities. Minimally depressed anterior
nasal bone fracture.

## 2017-07-08 ENCOUNTER — Ambulatory Visit: Payer: Self-pay | Admitting: Unknown Physician Specialty

## 2017-07-23 ENCOUNTER — Encounter: Payer: Self-pay | Admitting: Family Medicine

## 2017-07-23 ENCOUNTER — Ambulatory Visit: Payer: Managed Care, Other (non HMO) | Admitting: Family Medicine

## 2017-07-23 VITALS — BP 130/69 | HR 58 | Temp 97.8°F | Ht 68.0 in | Wt 158.1 lb

## 2017-07-23 DIAGNOSIS — R6884 Jaw pain: Secondary | ICD-10-CM

## 2017-07-23 DIAGNOSIS — Z7689 Persons encountering health services in other specified circumstances: Secondary | ICD-10-CM | POA: Diagnosis not present

## 2017-07-23 MED ORDER — CYCLOBENZAPRINE HCL 10 MG PO TABS: 10.0000 mg | ORAL_TABLET | Freq: Three times a day (TID) | ORAL | 1 refills | Status: AC | PRN

## 2017-07-23 MED ORDER — FLUTICASONE PROPIONATE 50 MCG/ACT NA SUSP: 2.0000 | Freq: Two times a day (BID) | NASAL | 6 refills | Status: DC

## 2017-07-23 MED ORDER — CETIRIZINE HCL 10 MG PO TABS: 10.0000 mg | ORAL_TABLET | Freq: Every day | ORAL | 11 refills | Status: DC

## 2017-07-23 NOTE — Progress Notes (Signed)
BP 130/69 (BP Location: Left Arm, Patient Position: Sitting, Cuff Size: Normal)   Pulse (!) 58   Temp 97.8 F (36.6 C) (Oral)   Ht 5\' 8"  (1.727 m)   Wt 158 lb 1.6 oz (71.7 kg)   SpO2 100%   BMI 24.04 kg/m    Subjective:    Patient ID: [name] [name], male    DOB: 1976/03/10, 42 y.o.   MRN: [id number]  HPI: [name] [name] is a 42 y.o. male  Chief Complaint  Patient presents with  . Establish Care  . Jaw Pain    Has had jaw pain x's 2 weeks. Causing his neck to hurt.  . Neck Pain   Pt here today to est care. States he's not had a primary care doctor in over 20 years. Has not had basic blood work in this time for the most part.   No known medical problems, not taking any medications. Only concern today is some new jaw pain the past 2 weeks that's becoming increasingly bothersome. Not always on the same side, but jaw will ache and radiate down into neck some. Feels like sometimes his throat is sore with it. Now on the left side, started on the right. Has tried some OTC pain relievers with no relief. Denies noticed swelling, redness, fevers, sweats, dysphagia, difficulty breathing. Current every day smoker. Has recently seen dentist, where nothing was found that could be contributing to this.    Relevant past medical, surgical, family and social history reviewed and updated as indicated. Interim medical history since our last visit reviewed. Allergies and medications reviewed and updated.  Review of Systems  Per HPI unless specifically indicated above     Objective:    BP 130/69 (BP Location: Left Arm, Patient Position: Sitting, Cuff Size: Normal)   Pulse (!) 58   Temp 97.8 F (36.6 C) (Oral)   Ht 5\' 8"  (1.727 m)   Wt 158 lb 1.6 oz (71.7 kg)   SpO2 100%   BMI 24.04 kg/m   Wt Readings from Last 3 Encounters:  07/23/17 158 lb 1.6 oz (71.7 kg)    Physical Exam  Constitutional: He is oriented to person, place, and time. He appears well-developed and well-nourished.    HENT:  Head: Atraumatic.  Oropharynx mildly erythematous posteriorly B/l TMJ non-tender, no crepitus with rom  Eyes: Conjunctivae are normal. Pupils are equal, round, and reactive to light. No scleral icterus.  Neck: Normal range of motion. Neck supple. No JVD present. No tracheal deviation present. No thyromegaly present.  Cardiovascular: Normal rate and normal heart sounds.  Pulmonary/Chest: Effort normal and breath sounds normal.  Musculoskeletal: Normal range of motion.  Lymphadenopathy:    He has no cervical adenopathy.  Neurological: He is alert and oriented to person, place, and time.  Skin: Skin is warm and dry.  Psychiatric: He has a normal mood and affect. His behavior is normal.  Nursing note and vitals reviewed.   No results found for this or any previous visit.    Assessment & Plan:   Problem List Items Addressed This Visit    None    Visit Diagnoses    Jaw pain    -  Primary   Encounter to establish care        No gross physical exam findings to explain his jaw pain, and no red flag symptoms currently. Pt opting to treat conservatively for now. Discussed several things that could potentially be causing his discomfort, such as chronic post-nasal drainage  contributing to intermittent lymphadenopathy and edema as well as TMJ from gritting teeth. Pt denies any GERD sxs, does not think that could be contributing. Will tx for other two possibilities with zyrtec and flonase as well as flexeril prn and getting a mouthguard for nighttime use. Continue OTC pain relievers prn.   Follow up plan: Return for CPE.

## 2017-07-26 NOTE — Patient Instructions (Signed)
Follow up for CPE 

## 2017-08-09 ENCOUNTER — Encounter: Payer: Managed Care, Other (non HMO) | Admitting: Family Medicine

## 2017-08-22 ENCOUNTER — Ambulatory Visit (INDEPENDENT_AMBULATORY_CARE_PROVIDER_SITE_OTHER): Payer: Managed Care, Other (non HMO) | Admitting: Family Medicine

## 2017-08-22 ENCOUNTER — Encounter: Payer: Self-pay | Admitting: Family Medicine

## 2017-08-22 ENCOUNTER — Other Ambulatory Visit: Payer: Self-pay

## 2017-08-22 VITALS — BP 152/95 | HR 90 | Temp 98.7°F | Ht 66.1 in | Wt 144.4 lb

## 2017-08-22 DIAGNOSIS — R945 Abnormal results of liver function studies: Secondary | ICD-10-CM

## 2017-08-22 DIAGNOSIS — Z0001 Encounter for general adult medical examination with abnormal findings: Secondary | ICD-10-CM | POA: Diagnosis not present

## 2017-08-22 DIAGNOSIS — Z114 Encounter for screening for human immunodeficiency virus [HIV]: Secondary | ICD-10-CM

## 2017-08-22 DIAGNOSIS — Z0000 Encounter for general adult medical examination without abnormal findings: Secondary | ICD-10-CM

## 2017-08-22 DIAGNOSIS — R634 Abnormal weight loss: Secondary | ICD-10-CM | POA: Diagnosis not present

## 2017-08-22 DIAGNOSIS — Z23 Encounter for immunization: Secondary | ICD-10-CM

## 2017-08-22 DIAGNOSIS — R7989 Other specified abnormal findings of blood chemistry: Secondary | ICD-10-CM

## 2017-08-22 DIAGNOSIS — R030 Elevated blood-pressure reading, without diagnosis of hypertension: Secondary | ICD-10-CM

## 2017-08-22 NOTE — Progress Notes (Signed)
BP (!) 152/95   Pulse 90   Temp 98.7 F (37.1 C) (Oral)   Ht 5' 6.1" (1.679 m)   Wt 144 lb 6 oz (65.5 kg)   SpO2 99%   BMI 23.23 kg/m    Subjective:    Patient ID: [name] [name], male    DOB: Mar 22, 1976, 42 y.o.   MRN: [id number]  HPI: [name] [name] is a 42 y.o. male presenting on 08/22/2017 for comprehensive medical examination. Current medical complaints include:see below  Only concern pt has today is ongoing unprovoked weight loss. Down 14 lb the past month and about 30-40 overall the past year with no notable lifestyle changes. Very concerned about cancer as he's got a strong fhx of multiple types of cancer and smokes 2 ppd of cigarettes. Unsure if he has any medical problems as he's not seen a PCP provider years. Denies night sweats, fevers, early satiety, bowel changes, chronic cough, SOB, hemoptysis, CP.   Not fasting for labs.  Depression Screen done today and results listed below:  [facility] 08/[facility]/2017  Decreased Interest 0  Down, Depressed, Hopeless 0  PHQ - 2 Score 0  Altered sleeping 0  Tired, decreased energy 0  Change in appetite 0  Feeling bad or failure about yourself  0  Trouble concentrating 0  Moving slowly or fidgety/restless 0  Suicidal thoughts 0  PHQ-9 Score 0    The patient does not have a history of falls. I did not complete a risk assessment for falls. A plan of care for falls was not documented.   Past Medical History:  History reviewed. No pertinent past medical history.  Surgical History:  History reviewed. No pertinent surgical history.  Medications:  Current Outpatient Medications on File Prior to Visit  Medication Sig  . cetirizine (ZYRTEC) 10 MG tablet Take 1 tablet (10 mg total) by mouth daily.  . cyclobenzaprine (FLEXERIL) 10 MG tablet Take 1 tablet (10 mg total) by mouth 3 (three) times daily as needed for muscle spasms.  . fluticasone (FLONASE) 50 MCG/ACT nasal spray Place 2 sprays into both nostrils 2 (two)  times daily.   No current facility-administered medications on file prior to visit.     Allergies:  Not on File  Social History:  Social History   Socioeconomic History  . Marital status: Single    Spouse name: Not on file  . Number of children: Not on file  . Years of education: Not on file  . Highest education level: Not on file  Occupational History  . Not on file  Social Needs  . Financial resource strain: Not on file  . Food insecurity:    Worry: Not on file    Inability: Not on file  . Transportation needs:    Medical: Not on file    Non-medical: Not on file  Tobacco Use  . Smoking status: Current Every Day Smoker  . Smokeless tobacco: Never Used  Substance and Sexual Activity  . Alcohol use: No    Frequency: Never  . Drug use: No  . Sexual activity: Not on file  Lifestyle  . Physical activity:    Days per week: Not on file    Minutes per session: Not on file  . Stress: Not on file  Relationships  . Social connections:    Talks on phone: Not on file    Gets together: Not on file    Attends religious service: Not on file    Active member of  club or organization: Not on file    Attends meetings of clubs or organizations: Not on file    Relationship status: Not on file  . Intimate partner violence:    Fear of current or ex partner: Not on file    Emotionally abused: Not on file    Physically abused: Not on file    Forced sexual activity: Not on file  Other Topics Concern  . Not on file  Social History Narrative  . Not on file   Social History   Tobacco Use  Smoking Status Current Every Day Smoker  Smokeless Tobacco Never Used   Social History   Substance and Sexual Activity  Alcohol Use No  . Frequency: Never    Family History:  Family History  Problem Relation Age of Onset  . Cancer Mother   . Cancer Father   . Cancer Maternal Grandmother   . Cancer Maternal Grandfather   . Cancer Paternal Grandmother   . Heart attack Paternal  Grandfather     Past medical history, surgical history, medications, allergies, family history and social history reviewed with patient today and changes made to appropriate areas of the chart.   Review of Systems - General ROS: positive for  - weight loss Psychological ROS: negative Ophthalmic ROS: negative ENT ROS: negative Allergy and Immunology ROS: negative Respiratory ROS: no cough, shortness of breath, or wheezing Cardiovascular ROS: no chest pain or dyspnea on exertion Gastrointestinal ROS: no abdominal pain, change in bowel habits, or black or bloody stools Genito-Urinary ROS: no dysuria, trouble voiding, or hematuria Musculoskeletal ROS: negative Neurological ROS: no TIA or stroke symptoms Dermatological ROS: negative All other ROS negative except what is listed above and in the HPI.      Objective:    BP (!) 152/95   Pulse 90   Temp 98.7 F (37.1 C) (Oral)   Ht 5' 6.1" (1.679 m)   Wt 144 lb 6 oz (65.5 kg)   SpO2 99%   BMI 23.23 kg/m   Wt Readings from Last 3 Encounters:  08/22/17 144 lb 6 oz (65.5 kg)  07/23/17 158 lb 1.6 oz (71.7 kg)    Physical Exam  Constitutional: He is oriented to person, place, and time. He appears well-developed and well-nourished. No distress.  HENT:  Head: Atraumatic.  Right Ear: External ear normal.  Left Ear: External ear normal.  Nose: Nose normal.  Mouth/Throat: Oropharynx is clear and moist.  Eyes: Pupils are equal, round, and reactive to light. Conjunctivae are normal. No scleral icterus.  Neck: Normal range of motion. Neck supple.  Cardiovascular: Normal rate, regular rhythm, normal heart sounds and intact distal pulses.  No murmur heard. Pulmonary/Chest: Effort normal and breath sounds normal. No respiratory distress.  Abdominal: Soft. Bowel sounds are normal. He exhibits no distension and no mass. There is no tenderness. There is no guarding.  Genitourinary:  Genitourinary Comments: Exam declined by pt  Musculoskeletal:  Normal range of motion. He exhibits no edema or tenderness.  Neurological: He is alert and oriented to person, place, and time. He has normal reflexes.  Skin: Skin is warm and dry. No rash noted.  Psychiatric: He has a normal mood and affect. His behavior is normal.  Nursing note and vitals reviewed.   Results for orders placed or performed in visit on 08/22/17  Microscopic Examination  Result Value Ref Range   WBC, UA 0-5 0 - 5 /hpf   RBC, UA 0-2 0 - 2 /hpf   Epithelial  Cells (non renal) 0-10 0 - 10 /hpf   Bacteria, UA None seen None seen/Few  CBC with Differential/Platelet  Result Value Ref Range   WBC 11.5 (H) 3.4 - 10.8 x10E3/uL   RBC 5.25 4.14 - 5.80 x10E6/uL   Hemoglobin 16.4 13.0 - 17.7 g/dL   Hematocrit 56.2 13.0 - 51.0 %   MCV 94 79 - 97 fL   MCH 31.2 26.6 - 33.0 pg   MCHC 33.3 31.5 - 35.7 g/dL   RDW 86.5 78.4 - 69.6 %   Platelets 343 150 - 379 x10E3/uL   Neutrophils 59 Not Estab. %   Lymphs 32 Not Estab. %   Monocytes 8 Not Estab. %   Eos 1 Not Estab. %   Basos 0 Not Estab. %   Neutrophils Absolute 6.8 1.4 - 7.0 x10E3/uL   Lymphocytes Absolute 3.7 (H) 0.7 - 3.1 x10E3/uL   Monocytes Absolute 0.9 0.1 - 0.9 x10E3/uL   EOS (ABSOLUTE) 0.1 0.0 - 0.4 x10E3/uL   Basophils Absolute 0.0 0.0 - 0.2 x10E3/uL   Immature Granulocytes 0 Not Estab. %   Immature Grans (Abs) 0.0 0.0 - 0.1 x10E3/uL  Comprehensive metabolic panel  Result Value Ref Range   Glucose 88 65 - 99 mg/dL   BUN 14 6 - 24 mg/dL   Creatinine, Ser 2.95 0.76 - 1.27 mg/dL   GFR calc non Af Amer 102 >59 mL/min/1.73   GFR calc Af Amer 117 >59 mL/min/1.73   BUN/Creatinine Ratio 15 9 - 20   Sodium 142 134 - 144 mmol/L   Potassium 4.6 3.5 - 5.2 mmol/L   Chloride 101 96 - 106 mmol/L   CO2 24 20 - 29 mmol/L   Calcium 10.0 8.7 - 10.2 mg/dL   Total Protein 8.1 6.0 - 8.5 g/dL   Albumin 5.0 3.5 - 5.5 g/dL   Globulin, Total 3.1 1.5 - 4.5 g/dL   Albumin/Globulin Ratio 1.6 1.2 - 2.2   Bilirubin Total 1.0 0.0 - 1.2  mg/dL   Alkaline Phosphatase 117 39 - 117 IU/L   AST 54 (H) 0 - 40 IU/L   ALT 63 (H) 0 - 44 IU/L  HIV antibody  Result Value Ref Range   HIV Screen 4th Generation wRfx Non Reactive Non Reactive  Lipid Panel w/o Chol/HDL Ratio  Result Value Ref Range   Cholesterol, Total 242 (H) 100 - 199 mg/dL   Triglycerides 284 0 - 149 mg/dL   HDL 87 >13 mg/dL   VLDL Cholesterol Cal 21 5 - 40 mg/dL   LDL Calculated 244 (H) 0 - 99 mg/dL  TSH  Result Value Ref Range   TSH 1.140 0.450 - 4.500 uIU/mL  UA/M w/rflx Culture, Routine  Result Value Ref Range   Specific Gravity, UA 1.025 1.005 - 1.030   pH, UA 6.0 5.0 - 7.5   Color, UA Yellow Yellow   Appearance Ur Clear Clear   Leukocytes, UA Negative Negative   Protein, UA 2+ (A) Negative/Trace   Glucose, UA Negative Negative   Ketones, UA 1+ (A) Negative   RBC, UA Trace (A) Negative   Bilirubin, UA Negative Negative   Urobilinogen, Ur 2.0 (H) 0.2 - 1.0 mg/dL   Nitrite, UA Negative Negative   Microscopic Examination See below:   Hepatitis panel, acute  Result Value Ref Range   Hep A IgM Negative Negative   Hepatitis B Surface Ag Negative Negative   Hep B C IgM Negative Negative   Hep C Virus Ab 7.7 (H) 0.0 -  0.9 s/co ratio  Specimen status report  Result Value Ref Range   specimen status report Comment       Assessment & Plan:   Problem List Items Addressed This Visit      Other   Weight loss    Concerning rate, without lifestyle changes. Will check all basic labs today and r/o common issues and go from there. Likely will get chest x-ray/CT given significant smoking hx. Continue to monitor weight and any new sxs closely      Elevated blood pressure reading - Primary    Elevated today, will continue to monitor closely. DASH diet discussed, pt admits to eating "junk food" frequently and will start cooking more at home.        Other Visit Diagnoses    Annual physical exam       Relevant Orders   CBC with Differential/Platelet  (Completed)   Comprehensive metabolic panel (Completed)   Lipid Panel w/o Chol/HDL Ratio (Completed)   TSH (Completed)   UA/M w/rflx Culture, Routine (Completed)   Screening for HIV (human immunodeficiency virus)       Relevant Orders   HIV antibody (Completed)   Immunization due       Relevant Orders   Tdap vaccine greater than or equal to 7yo IM (Completed)       Discussed aspirin prophylaxis for myocardial infarction prevention and decision was it was not indicated  LABORATORY TESTING:  Health maintenance labs ordered today as discussed above.   The natural history of prostate cancer and ongoing controversy regarding screening and potential treatment outcomes of prostate cancer has been discussed with the patient. The meaning of a false positive PSA and a false negative PSA has been discussed. He indicates understanding of the limitations of this screening test and wishes not to proceed with screening PSA testing.   IMMUNIZATIONS:   - Tdap: Tetanus vaccination status reviewed: Td vaccination indicated and given today. - Influenza: Refused  PATIENT COUNSELING:    Sexuality: Discussed sexually transmitted diseases, partner selection, use of condoms, avoidance of unintended pregnancy  and contraceptive alternatives.   Advised to avoid cigarette smoking.  I discussed with the patient that most people either abstain from alcohol or drink within safe limits (<=14/week and <=4 drinks/occasion for males, <=7/weeks and <= 3 drinks/occasion for females) and that the risk for alcohol disorders and other health effects rises proportionally with the number of drinks per week and how often a drinker exceeds daily limits.  Discussed cessation/primary prevention of drug use and availability of treatment for abuse.   Diet: Encouraged to adjust caloric intake to maintain  or achieve ideal body weight, to reduce intake of dietary saturated fat and total fat, to limit sodium intake by avoiding  high sodium foods and not adding table salt, and to maintain adequate dietary potassium and calcium preferably from fresh fruits, vegetables, and low-fat dairy products.    stressed the importance of regular exercise  Injury prevention: Discussed safety belts, safety helmets, smoke detector, smoking near bedding or upholstery.   Dental health: Discussed importance of regular tooth brushing, flossing, and dental visits.   Follow up plan: NEXT PREVENTATIVE PHYSICAL DUE IN 1 YEAR. Return for f/u dependent on lab results.

## 2017-08-23 ENCOUNTER — Telehealth: Payer: Self-pay | Admitting: Family Medicine

## 2017-08-23 DIAGNOSIS — R7989 Other specified abnormal findings of blood chemistry: Secondary | ICD-10-CM

## 2017-08-23 DIAGNOSIS — D72829 Elevated white blood cell count, unspecified: Secondary | ICD-10-CM

## 2017-08-23 DIAGNOSIS — R945 Abnormal results of liver function studies: Secondary | ICD-10-CM

## 2017-08-23 LAB — CBC WITH DIFFERENTIAL/PLATELET
BASOS ABS: 0 10*3/uL (ref 0.0–0.2)
Basos: 0 %
EOS (ABSOLUTE): 0.1 10*3/uL (ref 0.0–0.4)
Eos: 1 %
HEMOGLOBIN: 16.4 g/dL (ref 13.0–17.7)
Hematocrit: 49.3 % (ref 37.5–51.0)
Immature Grans (Abs): 0 10*3/uL (ref 0.0–0.1)
Immature Granulocytes: 0 %
LYMPHS ABS: 3.7 10*3/uL — AB (ref 0.7–3.1)
Lymphs: 32 %
MCH: 31.2 pg (ref 26.6–33.0)
MCHC: 33.3 g/dL (ref 31.5–35.7)
MCV: 94 fL (ref 79–97)
MONOCYTES: 8 %
Monocytes Absolute: 0.9 10*3/uL (ref 0.1–0.9)
NEUTROS ABS: 6.8 10*3/uL (ref 1.4–7.0)
Neutrophils: 59 %
Platelets: 343 10*3/uL (ref 150–379)
RBC: 5.25 x10E6/uL (ref 4.14–5.80)
RDW: 14 % (ref 12.3–15.4)
WBC: 11.5 10*3/uL — ABNORMAL HIGH (ref 3.4–10.8)

## 2017-08-23 LAB — LIPID PANEL W/O CHOL/HDL RATIO
Cholesterol, Total: 242 mg/dL — ABNORMAL HIGH (ref 100–199)
HDL: 87 mg/dL (ref >39)
LDL Calculated: 134 mg/dL — ABNORMAL HIGH (ref 0–99)
Triglycerides: 105 mg/dL (ref 0–149)
VLDL Cholesterol Cal: 21 mg/dL (ref 5–40)

## 2017-08-23 LAB — COMPREHENSIVE METABOLIC PANEL
ALBUMIN: 5 g/dL (ref 3.5–5.5)
ALK PHOS: 117 IU/L (ref 39–117)
ALT: 63 IU/L — ABNORMAL HIGH (ref 0–44)
AST: 54 IU/L — ABNORMAL HIGH (ref 0–40)
Albumin/Globulin Ratio: 1.6 (ref 1.2–2.2)
BILIRUBIN TOTAL: 1 mg/dL (ref 0.0–1.2)
BUN / CREAT RATIO: 15 (ref 9–20)
BUN: 14 mg/dL (ref 6–24)
CHLORIDE: 101 mmol/L (ref 96–106)
CO2: 24 mmol/L (ref 20–29)
Calcium: 10 mg/dL (ref 8.7–10.2)
Creatinine, Ser: 0.93 mg/dL (ref 0.76–1.27)
GFR calc Af Amer: 117 mL/min/{1.73_m2} (ref >59)
GFR calc non Af Amer: 102 mL/min/{1.73_m2} (ref >59)
Globulin, Total: 3.1 g/dL (ref 1.5–4.5)
Glucose: 88 mg/dL (ref 65–99)
POTASSIUM: 4.6 mmol/L (ref 3.5–5.2)
SODIUM: 142 mmol/L (ref 134–144)
Total Protein: 8.1 g/dL (ref 6.0–8.5)

## 2017-08-23 LAB — UA/M W/RFLX CULTURE, ROUTINE
Bilirubin, UA: NEGATIVE
Glucose, UA: NEGATIVE
Leukocytes, UA: NEGATIVE
NITRITE UA: NEGATIVE
SPEC GRAV UA: 1.025 (ref 1.005–1.030)
UUROB: 2 mg/dL — AB (ref 0.2–1.0)
pH, UA: 6 (ref 5.0–7.5)

## 2017-08-23 LAB — TSH: TSH: 1.14 u[IU]/mL (ref 0.450–4.500)

## 2017-08-23 LAB — MICROSCOPIC EXAMINATION: Bacteria, UA: NONE SEEN

## 2017-08-23 LAB — HIV ANTIBODY (ROUTINE TESTING W REFLEX): HIV SCREEN 4TH GENERATION: NONREACTIVE

## 2017-08-23 NOTE — Telephone Encounter (Signed)
Called pt regarding lab results and discussed the following:  - Elevated LFTs, which looking at past results from various ER visits seem to be chronic. Pt states he is "pretty sure he has Hep C but not definitely sure" and has never been treated if so. Will get hepatitis panel added on for further eval. Also notes some alcohol use fairly regularly.   - Elevated WBC and lymphocytes, without any current sxs. Was also elevated when last checked 3 years ago but unsure of those circumstances. Will have him come in for a recheck in the next few weeks and if still abnormal, will send to Hematology for further eval given his rapid unexplained weight loss.   -  Elevated cholesterol,for which he wants to just try managing with dietary changes for now. Will recheck at next follow up  - Discussed that since TSH was normal, would not be a bad idea to get a chest CT given his rapid unexplained weight loss. He would like to just monitor his weights at home for now

## 2017-08-25 DIAGNOSIS — R030 Elevated blood-pressure reading, without diagnosis of hypertension: Secondary | ICD-10-CM | POA: Insufficient documentation

## 2017-08-25 DIAGNOSIS — R634 Abnormal weight loss: Secondary | ICD-10-CM | POA: Insufficient documentation

## 2017-08-25 NOTE — Patient Instructions (Signed)
Follow up depending on lab results

## 2017-08-25 NOTE — Assessment & Plan Note (Signed)
Elevated today, will continue to monitor closely. DASH diet discussed, pt admits to eating "junk food" frequently and will start cooking more at home.

## 2017-08-25 NOTE — Assessment & Plan Note (Signed)
Concerning rate, without lifestyle changes. Will check all basic labs today and r/o common issues and go from there. Likely will get chest x-ray/CT given significant smoking hx. Continue to monitor weight and any new sxs closely

## 2017-08-26 ENCOUNTER — Other Ambulatory Visit: Payer: Self-pay | Admitting: Family Medicine

## 2017-08-26 DIAGNOSIS — B182 Chronic viral hepatitis C: Secondary | ICD-10-CM

## 2017-09-14 LAB — HEPATITIS PANEL, ACUTE
HEP B S AG: NEGATIVE
Hep A IgM: NEGATIVE
Hep B C IgM: NEGATIVE
Hep C Virus Ab: 7.7 s/co ratio — ABNORMAL HIGH (ref 0.0–0.9)

## 2017-09-14 LAB — SPECIMEN STATUS REPORT

## 2017-10-17 ENCOUNTER — Ambulatory Visit: Payer: Self-pay | Admitting: Gastroenterology

## 2017-11-08 ENCOUNTER — Ambulatory Visit: Payer: Self-pay | Admitting: Gastroenterology

## 2017-11-08 ENCOUNTER — Other Ambulatory Visit: Payer: Self-pay

## 2017-11-08 ENCOUNTER — Encounter: Payer: Self-pay | Admitting: Gastroenterology

## 2017-12-19 ENCOUNTER — Ambulatory Visit: Payer: Managed Care, Other (non HMO) | Admitting: Physician Assistant

## 2017-12-19 ENCOUNTER — Ambulatory Visit: Payer: Self-pay | Admitting: *Deleted

## 2017-12-19 VITALS — BP 163/94 | HR 99 | Temp 98.9°F | Wt 147.0 lb

## 2017-12-19 DIAGNOSIS — L089 Local infection of the skin and subcutaneous tissue, unspecified: Secondary | ICD-10-CM

## 2017-12-19 DIAGNOSIS — B182 Chronic viral hepatitis C: Secondary | ICD-10-CM | POA: Diagnosis not present

## 2017-12-19 MED ORDER — DOXYCYCLINE HYCLATE 100 MG PO TABS: 100.0000 mg | ORAL_TABLET | Freq: Two times a day (BID) | ORAL | 0 refills | Status: AC

## 2017-12-19 MED ORDER — AMOXICILLIN-POT CLAVULANATE 875-125 MG PO TABS: 1.0000 | ORAL_TABLET | Freq: Two times a day (BID) | ORAL | 0 refills | Status: DC

## 2017-12-19 NOTE — Telephone Encounter (Signed)
  Reason for Disposition . [1] Wound > 48 hours old AND [2] it becomes more tender  Answer Assessment - Initial Assessment Questions 1. LOCATION: "Where is the wound located?"      Front of left calf. 2. WOUND APPEARANCE: "What does the wound look like?"      Red, draining pus. 3. SIZE: If redness is present, ask: "What is the size of the red area?" (Inches, centimeters, or compare to size of a coin)      Entire site of the tattoo 4. SPREAD: "What's changed in the last day?"  "Do you see any red streaks coming from the wound?"     Draining pus 5. ONSET: "When did it start to look infected?"      yesterday 6. MECHANISM: "How did the wound start, what was the cause?"     New tattoo done 4 days ago. 7. PAIN: "Is there any pain?" If so, ask: "How bad is the pain?"   (Scale 1-10; or mild, moderate, severe)     10-is at work today. 8. FEVER: "Do you have a fever?" If so, ask: "What is your temperature, how was it measured, and when did it start?"     He is unsure.  9. OTHER SYMPTOMS: "Do you have any other symptoms?" (e.g., shaking chills, weakness, rash elsewhere on body)     no 10. PREGNANCY: "Is there any chance you are pregnant?" "When was your last menstrual period?"       na  Protocols used: WOUND INFECTION-A-AH

## 2017-12-19 NOTE — Progress Notes (Signed)
Subjective:    Patient ID: [name] [name], male    DOB: 1975-11-04, 42 y.o.   MRN: [id number]  [name] [name] is a 42 y.o. male presenting on 12/19/2017 for Wound Infection (Tattoo done on Sunday, started getting red yesterday. Redness spreading. Warm to the touch. )   HPI   History significant for chronic active hepatitis C. Presents today after giving his [name] [name] a tattoo on his left leg. He used pen ink and safety pin which he says he "sterilized" with a lighter and alcohol. He has done this before. Says the tattoo was becoming red and infected, with the redness spreading down his leg. His leg is painful. He denies fever, chills, nausea, and vomiting. He received a Tdap 08/2017. He denies using IV drugs currently.   Of note, he was scheduled for an appointment at GI for evaluation and treatment of hepatitis C. He did not attend this appoint because he says he did not have transportation.  Sunday - yesterday started looking infected   BP Readings from Last 3 Encounters:  12/19/17 (!) 163/94  08/22/17 (!) 152/95  07/23/17 130/69     Social History   Tobacco Use  . Smoking status: Current Every Day Smoker  . Smokeless tobacco: Never Used  Substance Use Topics  . Alcohol use: No    Frequency: Never  . Drug use: No    Review of Systems Per HPI unless specifically indicated above     Objective:    BP (!) 163/94   Pulse 99   Temp 98.9 F (37.2 C) (Oral)   Wt 147 lb (66.7 kg)   SpO2 99%   BMI 23.65 kg/m   Wt Readings from Last 3 Encounters:  12/19/17 147 lb (66.7 kg)  08/22/17 144 lb 6 oz (65.5 kg)  07/23/17 158 lb 1.6 oz (71.7 kg)    Physical Exam  Constitutional: He is oriented to person, place, and time. He appears well-developed and well-nourished.  Cardiovascular: Normal pulses.  Neurological: He is alert and oriented to person, place, and time.  Skin: Skin is warm and dry. There is erythema.  Please view photo. Erythema, warmth surrounding home tattoo that  extends inferiorly on left lower extremity. There is serous fluid weeping from wound that does not appear purulent. Leg is tender to palpation.     Media Information   Document Information   Photos    12/19/2017 16:20  Attached To:  Office Visit on 12/19/17 with [name] Sailors, PA-C  Source Information   [name] [name]  [facility]    Results for orders placed or performed in visit on 08/22/17  Microscopic Examination  Result Value Ref Range   WBC, UA 0-5 0 - 5 /hpf   RBC, UA 0-2 0 - 2 /hpf   Epithelial Cells (non renal) 0-10 0 - 10 /hpf   Bacteria, UA None seen None seen/Few  CBC with Differential/Platelet  Result Value Ref Range   WBC 11.5 (H) 3.4 - 10.8 x10E3/uL   RBC 5.25 4.14 - 5.80 x10E6/uL   Hemoglobin 16.4 13.0 - 17.7 g/dL   Hematocrit 40.9 81.1 - 51.0 %   MCV 94 79 - 97 fL   MCH 31.2 26.6 - 33.0 pg   MCHC 33.3 31.5 - 35.7 g/dL   RDW 91.4 78.2 - 95.6 %   Platelets 343 150 - 379 x10E3/uL   Neutrophils 59 Not Estab. %   Lymphs 32 Not Estab. %   Monocytes 8 Not Estab. %  Eos 1 Not Estab. %   Basos 0 Not Estab. %   Neutrophils Absolute 6.8 1.4 - 7.0 x10E3/uL   Lymphocytes Absolute 3.7 (H) 0.7 - 3.1 x10E3/uL   Monocytes Absolute 0.9 0.1 - 0.9 x10E3/uL   EOS (ABSOLUTE) 0.1 0.0 - 0.4 x10E3/uL   Basophils Absolute 0.0 0.0 - 0.2 x10E3/uL   Immature Granulocytes 0 Not Estab. %   Immature Grans (Abs) 0.0 0.0 - 0.1 x10E3/uL  Comprehensive metabolic panel  Result Value Ref Range   Glucose 88 65 - 99 mg/dL   BUN 14 6 - 24 mg/dL   Creatinine, Ser 7.82 0.76 - 1.27 mg/dL   GFR calc non Af Amer 102 >59 mL/min/1.73   GFR calc Af Amer 117 >59 mL/min/1.73   BUN/Creatinine Ratio 15 9 - 20   Sodium 142 134 - 144 mmol/L   Potassium 4.6 3.5 - 5.2 mmol/L   Chloride 101 96 - 106 mmol/L   CO2 24 20 - 29 mmol/L   Calcium 10.0 8.7 - 10.2 mg/dL   Total Protein 8.1 6.0 - 8.5 g/dL   Albumin 5.0 3.5 - 5.5 g/dL   Globulin, Total 3.1 1.5 - 4.5 g/dL    Albumin/Globulin Ratio 1.6 1.2 - 2.2   Bilirubin Total 1.0 0.0 - 1.2 mg/dL   Alkaline Phosphatase 117 39 - 117 IU/L   AST 54 (H) 0 - 40 IU/L   ALT 63 (H) 0 - 44 IU/L  HIV antibody  Result Value Ref Range   HIV Screen 4th Generation wRfx Non Reactive Non Reactive  Lipid Panel w/o Chol/HDL Ratio  Result Value Ref Range   Cholesterol, Total 242 (H) 100 - 199 mg/dL   Triglycerides 956 0 - 149 mg/dL   HDL 87 >21 mg/dL   VLDL Cholesterol Cal 21 5 - 40 mg/dL   LDL Calculated 308 (H) 0 - 99 mg/dL  TSH  Result Value Ref Range   TSH 1.140 0.450 - 4.500 uIU/mL  UA/M w/rflx Culture, Routine  Result Value Ref Range   Specific Gravity, UA 1.025 1.005 - 1.030   pH, UA 6.0 5.0 - 7.5   Color, UA Yellow Yellow   Appearance Ur Clear Clear   Leukocytes, UA Negative Negative   Protein, UA 2+ (A) Negative/Trace   Glucose, UA Negative Negative   Ketones, UA 1+ (A) Negative   RBC, UA Trace (A) Negative   Bilirubin, UA Negative Negative   Urobilinogen, Ur 2.0 (H) 0.2 - 1.0 mg/dL   Nitrite, UA Negative Negative   Microscopic Examination See below:   Hepatitis panel, acute  Result Value Ref Range   Hep A IgM Negative Negative   Hepatitis B Surface Ag Negative Negative   Hep B C IgM Negative Negative   Hep C Virus Ab 7.7 (H) 0.0 - 0.9 s/co ratio  Specimen status report  Result Value Ref Range   specimen status report Comment       Assessment & Plan:  1. Skin infection  Last tetanus shot four months ago. Very concerned for spreading infection. Does not appear purulent so initially gave Augmentin but learned patient did not schedule follow up and so I called pharmacy to change it to doxycycline as below. Pharmacy confirmed to me that they were able to contact patient and provide this Rx. I have counseled him that if he has worsening infection he should be seen in the ER.  - doxycycline (VIBRA-TABS) 100 MG tablet; Take 1 tablet (100 mg total) by mouth 2 (two)  times daily.  Dispense: 20 tablet;  Refill: 0  2. Chronic active hepatitis C  Patient does not appear overly concerned about this. Reiterated that he needs to schedule follow up with GI, and he says he will.     Follow up plan: Return in about 1 day (around 12/20/2017) for Skin infection .  [name] AngstAdriana Pollak, PA-C [facility] 12/19/2017, 6:43 PM

## 2017-12-19 NOTE — Patient Instructions (Addendum)
If you have a fever, nausea, vomiting, increasing redness, increasing pain, confusion, PLEASE GO TO THE ER.     Cellulitis, Adult Cellulitis is a skin infection. The infected area is usually red and sore. This condition occurs most often in the arms and lower legs. It is very important to get treated for this condition. Follow these instructions at home:  Take over-the-counter and prescription medicines only as told by your doctor.  If you were prescribed an antibiotic medicine, take it as told by your doctor. Do not stop taking the antibiotic even if you start to feel better.  Drink enough fluid to keep your pee (urine) clear or pale yellow.  Do not touch or rub the infected area.  Raise (elevate) the infected area above the level of your heart while you are sitting or lying down.  Place warm or cold wet cloths (warm or cold compresses) on the infected area. Do this as told by your doctor.  Keep all follow-up visits as told by your doctor. This is important. These visits let your doctor make sure your infection is not getting worse. Contact a doctor if:  You have a fever.  Your symptoms do not get better after 1-2 days of treatment.  Your bone or joint under the infected area starts to hurt after the skin has healed.  Your infection comes back. This can happen in the same area or another area.  You have a swollen bump in the infected area.  You have new symptoms.  You feel ill and also have muscle aches and pains. Get help right away if:  Your symptoms get worse.  You feel very sleepy.  You throw up (vomit) or have watery poop (diarrhea) for a long time.  There are red streaks coming from the infected area.  Your red area gets larger.  Your red area turns darker. This information is not intended to replace advice given to you by your health care provider. Make sure you discuss any questions you have with your health care provider. Document Released: 10/17/2007  Document Revised: 10/06/2015 Document Reviewed: 03/09/2015 Elsevier Interactive Patient Education  2018 [employer]

## 2017-12-20 ENCOUNTER — Ambulatory Visit: Payer: Managed Care, Other (non HMO) | Admitting: Family Medicine

## 2018-08-12 ENCOUNTER — Other Ambulatory Visit: Payer: Self-pay | Admitting: Family Medicine

## 2018-08-12 MED ORDER — CETIRIZINE HCL 10 MG PO TABS: 10.0000 mg | ORAL_TABLET | Freq: Every day | ORAL | 1 refills | Status: AC

## 2018-08-12 MED ORDER — FLUTICASONE PROPIONATE 50 MCG/ACT NA SUSP: 2.0000 | Freq: Two times a day (BID) | NASAL | 1 refills | Status: AC

## 2018-12-12 ENCOUNTER — Ambulatory Visit: Payer: Managed Care, Other (non HMO) | Admitting: Family Medicine

## 2018-12-29 ENCOUNTER — Telehealth: Payer: Self-pay | Admitting: Family Medicine

## 2018-12-29 ENCOUNTER — Ambulatory Visit: Payer: Managed Care, Other (non HMO) | Admitting: Nurse Practitioner

## 2018-12-29 NOTE — Telephone Encounter (Signed)
Called pt to let him know that Apolonio Schneiders is on vacation and we don't have anything available today. Pt states that he was currently on hold with someone else to be seen and will call us back if he needed to.

## 2018-12-30 ENCOUNTER — Ambulatory Visit: Payer: Managed Care, Other (non HMO) | Admitting: Nurse Practitioner

## 2018-12-31 ENCOUNTER — Ambulatory Visit: Payer: Self-pay | Admitting: Nurse Practitioner

## 2019-01-01 ENCOUNTER — Ambulatory Visit: Payer: Managed Care, Other (non HMO) | Admitting: Nurse Practitioner
